# Patient Record
Sex: Female | Born: 1950
Health system: Southern US, Community
[De-identification: ages and names within clinical notes are randomized; demographics above are authoritative.]

## PROBLEM LIST (undated history)

## (undated) DIAGNOSIS — M1990 Unspecified osteoarthritis, unspecified site: Secondary | ICD-10-CM

## (undated) DIAGNOSIS — Z5189 Encounter for other specified aftercare: Secondary | ICD-10-CM

## (undated) DIAGNOSIS — F32A Depression, unspecified: Secondary | ICD-10-CM

## (undated) DIAGNOSIS — F419 Anxiety disorder, unspecified: Secondary | ICD-10-CM

## (undated) DIAGNOSIS — F329 Major depressive disorder, single episode, unspecified: Secondary | ICD-10-CM

## (undated) DIAGNOSIS — T7840XA Allergy, unspecified, initial encounter: Secondary | ICD-10-CM

## (undated) DIAGNOSIS — R011 Cardiac murmur, unspecified: Secondary | ICD-10-CM

## (undated) DIAGNOSIS — B977 Papillomavirus as the cause of diseases classified elsewhere: Secondary | ICD-10-CM

## (undated) DIAGNOSIS — F99 Mental disorder, not otherwise specified: Secondary | ICD-10-CM

## (undated) DIAGNOSIS — K219 Gastro-esophageal reflux disease without esophagitis: Secondary | ICD-10-CM

## (undated) DIAGNOSIS — E079 Disorder of thyroid, unspecified: Secondary | ICD-10-CM

## (undated) HISTORY — PX: HERNIA REPAIR: SHX51

## (undated) HISTORY — DX: Unspecified osteoarthritis, unspecified site: M19.90

## (undated) HISTORY — PX: RADIAL OPTIC NEUROTOMY: SHX2282

## (undated) HISTORY — DX: Anxiety disorder, unspecified: F41.9

## (undated) HISTORY — DX: Cardiac murmur, unspecified: R01.1

## (undated) HISTORY — PX: MENISCUS REPAIR: SHX5179

## (undated) HISTORY — DX: Gastro-esophageal reflux disease without esophagitis: K21.9

## (undated) HISTORY — DX: Mental disorder, not otherwise specified: F99

## (undated) HISTORY — PX: DILATION AND CURETTAGE OF UTERUS: SHX78

## (undated) HISTORY — DX: Depression, unspecified: F32.A

## (undated) HISTORY — DX: Encounter for other specified aftercare: Z51.89

## (undated) HISTORY — DX: Papillomavirus as the cause of diseases classified elsewhere: B97.7

## (undated) HISTORY — DX: Disorder of thyroid, unspecified: E07.9

## (undated) HISTORY — DX: Allergy, unspecified, initial encounter: T78.40XA

## (undated) HISTORY — PX: SEPTOPLASTY: SUR1290

## (undated) HISTORY — PX: LUMBAR SPINAL CORD SIMULATOR LEAD REMOVAL: SHX6813

## (undated) HISTORY — PX: TONSILLECTOMY: SHX5217

## (undated) HISTORY — PX: CATARACT EXTRACTION, BILATERAL: SHX1313

## (undated) HISTORY — DX: Major depressive disorder, single episode, unspecified: F32.9

---

## 1989-02-27 HISTORY — PX: BREAST REDUCTION SURGERY: SHX8

## 1997-08-28 ENCOUNTER — Other Ambulatory Visit: Admission: RE | Admit: 1997-08-28 | Discharge: 1997-08-28 | Payer: Self-pay | Admitting: Obstetrics and Gynecology

## 1997-09-25 ENCOUNTER — Other Ambulatory Visit: Admission: RE | Admit: 1997-09-25 | Discharge: 1997-09-25 | Payer: Self-pay | Admitting: *Deleted

## 1997-12-07 ENCOUNTER — Other Ambulatory Visit: Admission: RE | Admit: 1997-12-07 | Discharge: 1997-12-07 | Payer: Self-pay | Admitting: Obstetrics and Gynecology

## 1999-01-11 ENCOUNTER — Other Ambulatory Visit: Admission: RE | Admit: 1999-01-11 | Discharge: 1999-01-11 | Payer: Self-pay | Admitting: Obstetrics and Gynecology

## 2000-09-15 ENCOUNTER — Emergency Department (HOSPITAL_COMMUNITY): Admission: EM | Admit: 2000-09-15 | Discharge: 2000-09-15 | Payer: Self-pay | Admitting: Emergency Medicine

## 2001-03-15 ENCOUNTER — Ambulatory Visit (HOSPITAL_COMMUNITY): Admission: RE | Admit: 2001-03-15 | Discharge: 2001-03-15 | Payer: Self-pay | Admitting: Gastroenterology

## 2001-05-09 ENCOUNTER — Emergency Department (HOSPITAL_COMMUNITY): Admission: EM | Admit: 2001-05-09 | Discharge: 2001-05-09 | Payer: Self-pay | Admitting: Emergency Medicine

## 2001-08-06 ENCOUNTER — Ambulatory Visit (HOSPITAL_COMMUNITY): Admission: RE | Admit: 2001-08-06 | Discharge: 2001-08-06 | Payer: Self-pay | Admitting: Orthopedic Surgery

## 2001-08-06 ENCOUNTER — Encounter: Payer: Self-pay | Admitting: Orthopedic Surgery

## 2001-12-16 ENCOUNTER — Encounter: Payer: Self-pay | Admitting: Orthopedic Surgery

## 2001-12-16 ENCOUNTER — Ambulatory Visit (HOSPITAL_COMMUNITY): Admission: RE | Admit: 2001-12-16 | Discharge: 2001-12-16 | Payer: Self-pay | Admitting: Orthopedic Surgery

## 2002-02-25 ENCOUNTER — Other Ambulatory Visit: Admission: RE | Admit: 2002-02-25 | Discharge: 2002-02-25 | Payer: Self-pay | Admitting: Obstetrics and Gynecology

## 2002-02-27 HISTORY — PX: ROTATOR CUFF REPAIR: SHX139

## 2003-02-25 ENCOUNTER — Ambulatory Visit (HOSPITAL_COMMUNITY): Admission: RE | Admit: 2003-02-25 | Discharge: 2003-02-25 | Payer: Self-pay | Admitting: Rheumatology

## 2003-02-26 ENCOUNTER — Other Ambulatory Visit: Admission: RE | Admit: 2003-02-26 | Discharge: 2003-02-26 | Payer: Self-pay | Admitting: Obstetrics and Gynecology

## 2003-02-28 HISTORY — PX: LAMINECTOMY: SHX219

## 2003-04-03 ENCOUNTER — Emergency Department (HOSPITAL_COMMUNITY): Admission: EM | Admit: 2003-04-03 | Discharge: 2003-04-03 | Payer: Self-pay | Admitting: Emergency Medicine

## 2003-04-15 ENCOUNTER — Encounter: Admission: RE | Admit: 2003-04-15 | Discharge: 2003-04-15 | Payer: Self-pay | Admitting: Specialist

## 2003-06-19 ENCOUNTER — Ambulatory Visit (HOSPITAL_COMMUNITY): Admission: RE | Admit: 2003-06-19 | Discharge: 2003-06-20 | Payer: Self-pay | Admitting: Specialist

## 2003-08-11 ENCOUNTER — Encounter: Admission: RE | Admit: 2003-08-11 | Discharge: 2003-08-11 | Payer: Self-pay | Admitting: Specialist

## 2003-08-20 ENCOUNTER — Encounter: Admission: RE | Admit: 2003-08-20 | Discharge: 2003-08-20 | Payer: Self-pay | Admitting: Specialist

## 2003-09-07 ENCOUNTER — Encounter: Admission: RE | Admit: 2003-09-07 | Discharge: 2003-09-07 | Payer: Self-pay | Admitting: Specialist

## 2003-10-09 ENCOUNTER — Ambulatory Visit (HOSPITAL_BASED_OUTPATIENT_CLINIC_OR_DEPARTMENT_OTHER): Admission: RE | Admit: 2003-10-09 | Discharge: 2003-10-09 | Payer: Self-pay | Admitting: General Surgery

## 2003-11-19 ENCOUNTER — Ambulatory Visit (HOSPITAL_COMMUNITY): Admission: RE | Admit: 2003-11-19 | Discharge: 2003-11-19 | Payer: Self-pay | Admitting: Orthopaedic Surgery

## 2004-04-06 ENCOUNTER — Other Ambulatory Visit: Admission: RE | Admit: 2004-04-06 | Discharge: 2004-04-06 | Payer: Self-pay | Admitting: Obstetrics and Gynecology

## 2004-05-12 ENCOUNTER — Encounter
Admission: RE | Admit: 2004-05-12 | Discharge: 2004-05-12 | Payer: Self-pay | Admitting: Physical Medicine and Rehabilitation

## 2004-06-20 ENCOUNTER — Encounter: Admission: RE | Admit: 2004-06-20 | Discharge: 2004-06-20 | Payer: Self-pay | Admitting: General Surgery

## 2004-08-19 ENCOUNTER — Ambulatory Visit (HOSPITAL_COMMUNITY): Admission: RE | Admit: 2004-08-19 | Discharge: 2004-08-19 | Payer: Self-pay | Admitting: General Surgery

## 2004-12-05 ENCOUNTER — Ambulatory Visit (HOSPITAL_COMMUNITY)
Admission: RE | Admit: 2004-12-05 | Discharge: 2004-12-05 | Payer: Self-pay | Admitting: Physical Medicine and Rehabilitation

## 2005-02-27 HISTORY — PX: SPINE SURGERY: SHX786

## 2005-04-11 ENCOUNTER — Other Ambulatory Visit: Admission: RE | Admit: 2005-04-11 | Discharge: 2005-04-11 | Payer: Self-pay | Admitting: Obstetrics and Gynecology

## 2005-05-29 ENCOUNTER — Ambulatory Visit (HOSPITAL_COMMUNITY)
Admission: RE | Admit: 2005-05-29 | Discharge: 2005-05-29 | Payer: Self-pay | Admitting: Physical Medicine and Rehabilitation

## 2006-08-15 ENCOUNTER — Encounter: Admission: RE | Admit: 2006-08-15 | Discharge: 2006-08-15 | Payer: Self-pay | Admitting: Internal Medicine

## 2007-01-07 ENCOUNTER — Ambulatory Visit (HOSPITAL_COMMUNITY): Admission: RE | Admit: 2007-01-07 | Discharge: 2007-01-07 | Payer: Self-pay | Admitting: Surgery

## 2007-02-01 ENCOUNTER — Emergency Department (HOSPITAL_COMMUNITY): Admission: EM | Admit: 2007-02-01 | Discharge: 2007-02-01 | Payer: Self-pay | Admitting: Emergency Medicine

## 2007-02-11 ENCOUNTER — Encounter: Admission: RE | Admit: 2007-02-11 | Discharge: 2007-02-11 | Payer: Self-pay | Admitting: Internal Medicine

## 2007-03-31 HISTORY — PX: POLYPECTOMY: SHX149

## 2007-04-26 ENCOUNTER — Ambulatory Visit (HOSPITAL_COMMUNITY): Admission: RE | Admit: 2007-04-26 | Discharge: 2007-04-26 | Payer: Self-pay | Admitting: Obstetrics and Gynecology

## 2007-04-26 ENCOUNTER — Encounter (INDEPENDENT_AMBULATORY_CARE_PROVIDER_SITE_OTHER): Payer: Self-pay | Admitting: Obstetrics and Gynecology

## 2007-11-28 ENCOUNTER — Encounter
Admission: RE | Admit: 2007-11-28 | Discharge: 2007-11-28 | Payer: Self-pay | Admitting: Physical Medicine and Rehabilitation

## 2009-12-06 ENCOUNTER — Encounter: Admission: RE | Admit: 2009-12-06 | Discharge: 2009-12-06 | Payer: Self-pay | Admitting: Internal Medicine

## 2010-02-27 HISTORY — PX: OTHER SURGICAL HISTORY: SHX169

## 2010-03-20 ENCOUNTER — Encounter: Payer: Self-pay | Admitting: Specialist

## 2010-07-11 ENCOUNTER — Other Ambulatory Visit: Payer: Self-pay | Admitting: Dermatology

## 2010-07-12 NOTE — Op Note (Signed)
[name], Tracey                ACCOUNT NO.:  [id number]   MEDICAL RECORD NO.:  [id number]          PATIENT TYPE:  AMB   LOCATION:  DAY                          FACILITY:  [facility]   PHYSICIAN:  Ardeth Sportsman, MD     DATE OF BIRTH:  18-Oct-1950   DATE OF PROCEDURE:  01/07/2007  DATE OF DISCHARGE:                               OPERATIVE REPORT   PRIMARY CARE PHYSICIAN:  Juline Patch, M.D.   GASTROENTEROLOGIST:  Anselmo Rod, M.D.   SURGEON:  Ardeth Sportsman, MD   ASSISTANT:  None.   PREOPERATIVE DIAGNOSIS:  Left greater than right groin pains.  Possible  new left inguinal, or possible right indirect inguinal hernia.   POSTOPERATIVE DIAGNOSES:  1. Left indirect inguinal hernia.  2. Right, possible small, direct, recurrent, versus near obturator      defect.  3. Uterine fibroid at the dome of her uterus.   PROCEDURE:  1. Diagnostic laparoscopy.  2. Laparoscopic preperitoneal exploration with bilateral inguinal      hernia repair including coverage of indirect, direct, femoral, and      obturator defects.   ANESTHESIA:  1. General.  2. Local and a second field block around the port sites.  3. Bilateral ilioinguinal/general femoral nerve blocks.   SPECIMENS:  None.   DRAINS:  None.   ESTIMATED BLOOD LOSS:  100 mL.   COMPLICATIONS:  No major complications.   INDICATIONS:  [name] is a 60 year old female, very physically active  who has had atypical groin pain in the past.  She was found to have a  right inguinal hernia and then a right femoral hernia which were  repaired, open with mesh.  However, she started having new pain in the  left side with negative diagnostic workup.  She has had no improvement  on nonsteroidals for several weeks.  Because of her elusive exam and  workup in the past with a very similar history, I recommended that we do  diagnostic laparoscopy with possible left inguinal hernia repair.   On the day of surgery she noted that she was having  right groin  discomfort as well, and wondered if that was an issue.  She wishes that  area checked as well.  The risk of surgery such as stroke, heart attack,  deep venous thrombosis, pulmonary embolism, and death were discussed.  The risks such as bleeding, need for transfusion, wound infection,  abscess, injury to the organs, recurrence, incorrect diagnosis, and  other risk for discussed; and she agreed to proceed.   OPERATIVE FINDINGS:  She had a small but definite indirect inguinal  hernia on the left side.  The right side had some dense inflammation  from herr prior open inguinal and femoral hernia repairs.  She had dense  near her femoral region, and the medial to the inferior epigastric  vessels as well as some weakness there, concernibng for a direct defect.  There is a possible small obturator defect as well.  There was no  definite evidence of incarceration or strangulation.   DESCRIPTION OF PROCEDURE:  Informed consent was  confirmed.  The patient  underwent general anesthesia without any difficulty.  She had sequential  compression devices activated during the entire case.  She had IV  antibiotics given.  She was placed supine with both arms are.  She  underwent general anesthesia without any difficulty.  She had a Foley  catheter sterilely placed.  Her abdomen was prepped and draped in a  sterile fashion.   Entry with gained in the abdominal wall through an infraumbilical  curvilinear incision.  A nick was made in the rectus abdominis fascia,  just to the right of the midline, and a 12 mm Hasson port was passed in  the preperitoneal plane.  Capnopreperitoneum to 15 mmHg provided good  abdominal wall inflation.  Camera was used to help free the peritoneum  off the anterior abdominal wall.  Enough working space was created so  that 5 mm ports could be placed in the right-and-left midabdomen.   Once enough of the preperitoneum space was freed out, I switched to a  diagnostic  laparoscopy.  The 5 mm ports were directed in the right  midabdomen.  The abdominal port was directed into the peritoneal cavity.  The capnopreperitoneum  was reduced.  The camera was switched over to  5/30 degrees.  On direct visualization the other two ports were  redirected into the peritoneal cavity.  Camera inspection revealed an  obvious indirect inguinal hernia on the left side.  The right side had  some dense adhesions to the right inguinal region and it was difficult  to ascertain if she had a true hernia there or not.   I decided to do preperitoneal exploration.  The peritoneum focus was  turned to the left side.  The peritoneum was peeled off the lateral side  wall and down towards the internal ring.  The peritoneum was crawling up  with the round ligament up into the inguinal canal consistent with  indirect inguinal hernia.  An attempt was made to skeletonize the  peritoneum off the round ligament but in skeletonizing this the round  ligament started falling apart.  Therefore, I used cautery to ligate the  round ligament and it was transected.  The peritoneum was peeled back as  proximally as possible.  A window was made between the anterolateral  pelvic wall and the anterolateral bladder to expose this region in the  obturator and there was no evidence of any abnormalities there.   Attention was turned towards the right side.  Peritoneum was freed off  laterally rather easily and coming down and actually get behind and I  could free the peritoneum rather easily off the iliac vessels.  In  reflecting the peritoneum and freeing up posteriorly there were some  dense adhesions to the femoral canal.  Using careful sharp dissection  with no energy, I was able to free the peritoneum off this region.  There was a tear made in the peritoneum in this region and it was closed  using 3-0 Vicryl using laparoscopic suturing.  This helped to be able to  free the peritoneum as posteriorly  as possible. There was no evidence of  any indirect defect.  However, she did have some dense adhesions to her  direct space as well as on the way down to the obturator region. Freeing  off the direct space there was a laxity in that region concerning for a  possible small direct inguinal hernia as well.   I was able to free the peritoneum and  the anterolateral bladder somewhat  off the anterolateral pelvic wall.  The round ligament was skeletonized  and preserved.  There was a small branch off the round ligament bleeding  that was controlled with clips.  The round ligament was preserved.  I  was able to get down to the obturator canal and there was a little bit  of laxity in that region as well concerning for a small defect as well.   Because there had been a moderate amount of dissection and dense  adhesions near the bladder, I asked anesthesia to give indigo carmine.  It turned te urine a dark blue.  There was no leak of blue dye in the  preperitoneal fields.  The peritoneum was able to be grasped and pulled  cephalad to make sure there was good exposure with dissection off of the  pelvic side wall and make sure that everything was reduced out.   A 15 x 15 cm light weight (polypropylene) ULTRAPRO mesh was used for  each side.  The meshes were cut to a half-skull shape.  Mesh was placed  in each side in mirror image fashion such that the medial and inferior  flap rested covering around in the true pelvis between the  anterolateral, bilateral, and near the obturator foramen well.  The  meshes laid well posteriorly and proximally as the peritoneum was pulled  back on both sides.  The meshes laid laterally and superiorly such that  there was at least 3 inches circumferential coverage about direct,  indirect and femoral hernia defects.  A good swathe of flap of mesh was  covering around the level of the obturator as well.  Lead points were  grasped from both sides and elevated anteriorly  and then cephalad as the  capnopreperitoneum  was evacuated.  The ports were removed.  Fascia  defect was closed using #0 Vicryl stitch.  Skin was closed using 4-0  Monocryl stitch.  Sterile dressings were applied.  The patient was taken  to the recovery room in stable condition.  I explained the operative  findings to the patient's friend.  Questions answered and she expressed  understanding and appreciation.      Ardeth Sportsman, MD  Electronically Signed     SCG/MEDQ  D:  01/07/2007  T:  01/07/2007  Job:  [id number]   cc:   Juline Patch, M.D.  Fax: [phone]   JXBJYN WGN FAOZ, M.D.  Fax: [phone]

## 2010-07-12 NOTE — H&P (Signed)
Tracey [name], Tracey [name]                ACCOUNT NO.:  [id number]   MEDICAL RECORD NO.:  [id number]          PATIENT TYPE:  AMB   LOCATION:  SDC                           FACILITY:  [facility]   PHYSICIAN:  Hal Morales, M.D.DATE OF BIRTH:  February 21, 1951   DATE OF ADMISSION:  DATE OF DISCHARGE:                              HISTORY & PHYSICAL   DATE OF OPERATION:  April 26, 2007.   HISTORY OF PRESENT ILLNESS:  The patient is a 60 year old white married  female, para 0, who presents for evaluation of postmenopausal bleeding.  The patient has been menopausal since 2000 and has used hormone  replacement therapy since that time.  Her current regimen is a  bioidentical hormone regimen started by Dr. Austin Miles. Wanek, which  includes estradiol 0.75 mg with progesterone 100 mg daily.  The patient  had had no bleeding for the entirety of her menopausal time frame until  the beginning of February, when she had 3 days of bleeding, requiring 2  changes of pads per day.  She had some minimal cramping and came in for  evaluation.  At that time, she was set up for endometrial biopsy,  which  was not successful because of a stenotic cervix.  In the meantime, she  underwent a CBC and thyroid panel, both of which were normal.  She then  stopped bleeding but restarted bleeding more heavily on April 19, 2007, and continued bleeding through the present time.   PAST MEDICAL HISTORY:  Gynecologic:  The patient has a history of a  uterine fibroid which is pedunculated, the largest dimension being 2.5  cm, which has always been asymptomatic.  She has used an IUD for  contraception in the past.  She has always had normal Pap smears.  Medical history is significant for irritable bowel syndrome,  fibromyalgia, depression, anxiety.  The patient also has thyroid disease  and asthma.   SURGICAL HISTORY:  The patient has had two inguinal hernia repairs.  She  has had placement of a spinal cord stimulator.  She has  had a breast  reduction in the past.   CURRENT MEDICATIONS:  Rhinocort, estrogen, progesterone, Synthroid,  Accolate, Allegra, Klonopin, glucosamine chondroitin, fish oil, omega-3  fatty acids, Symbicort, Viactiv, Colace, vitamin C.   DRUG ALLERGIES:  INAPSINE AND CLINDAMYCIN.   REVIEW OF SYSTEMS:  Positive for back pain, thought to be secondary to  degenerative joint disease, hot flashes and occasional urinary urgency.   PHYSICAL EXAMINATION:  GENERAL:  The patient is a well-developed white  female in no acute distress.  VITAL SIGNS:  Blood pressure is 110/72.  Weight is 161 pounds.  Height  is 5 feet 3 inches.  LUNGS:  Clear.  HEART:  Regular rate and rhythm.  ABDOMEN:  Soft without masses or organomegaly.  EXTREMITIES:  No clubbing, cyanosis or edema.  PELVIC:  EG, BUS, within normal limits.  The vagina is atrophic.  The  cervix is stenotic.  The uterus is upper limits of normal size, mobile  and nontender.  Adnexa:  No masses.   LABORATORY STUDIES:  Ultrasound performed on April 23, 2007, showed a  posterior pedunculated fibroid measuring 1.6 cm in greatest dimension.  In the endometrium there is a hyperechoic mass measuring 0.67 cm in the  greatest dimension suggestive of a polyp.  The ovaries are within normal  limits.   IMPRESSION:  1. Postmenopausal bleeding.  2. Severely stenotic cervix precluding in-office endometrial biopsy.   DISPOSITION:  The patient will undergo hysteroscopy, dilatation and  curettage.  The risks of anesthesia, bleeding, infection, and damage to  adjacent organs as well as the risks of uterine perforation were all  explained, and the patient wishes to proceed.  This will be done at  [facility] on April 26, 2007.      Hal Morales, M.D.  Electronically Signed     VPH/MEDQ  D:  04/24/2007  T:  04/24/2007  Job:  [id number]

## 2010-07-12 NOTE — Op Note (Signed)
Tracey [name], Tracey [name]                ACCOUNT NO.:  [id number]   MEDICAL RECORD NO.:  [id number]          PATIENT TYPE:  AMB   LOCATION:  SDC                           FACILITY:  [facility]   PHYSICIAN:  Hal Morales, M.D.DATE OF BIRTH:  1950/11/02   DATE OF PROCEDURE:  04/26/2007  DATE OF DISCHARGE:                               OPERATIVE REPORT   PREOPERATIVE DIAGNOSIS:  Postmenopausal bleeding.   POSTOPERATIVE DIAGNOSIS:  Postmenopausal bleeding.   OPERATION:  Hysteroscopy D&C.   SURGEON:  Dr. Dierdre Forth.   ANESTHESIA:  General.   ESTIMATED BLOOD LOSS:  Less than 10 mL.   COMPLICATIONS:  None.   FINDINGS:  The uterus sounded to 7-1/2 cm.  There was a small amount of  fluffy endometrium in the cavity but no specific lesions were noted at  the time of hysteroscopy.   PROCEDURE:  The patient was taken to the operating room after  appropriate identification and placed on the operating table.  After the  attainment of adequate general anesthesia, she was placed in the  lithotomy position.  The perineum and vagina were prepped with multiple  layers of Betadine and the bladder emptied with a red Robinson catheter  under sterile conditions.  The perineum was draped as a sterile field.  A Graves speculum was placed in the vagina and a single-tooth tenaculum  placed on the anterior cervix.  A paracervical block was achieved with a  total of 10 mL of 2% Xylocaine in the 5 and 7 o'clock positions.  The  cervix was then dilated to accommodate the diagnostic hysteroscope.  The  uterus was sounded.  The endocervical curettings were obtained.  The  hysteroscope was used to visualize the endometrial cavity and the above-  noted findings were made and documented.  A grasping forceps was used to  sample an area of shaggy endometrium.  The remainder of the endometrial  cavity was then sharply curetted with a curette and those curettings  removed from the operative field.  All instruments  were then removed  from the vagina and the patient awakened from general anesthesia and  taken to the recovery room in satisfactory condition having tolerated  the procedure well.  Sponge and instrument counts correct.   SPECIMENS TO PATHOLOGY:  Endometrial curettings and endocervical  curettings.   DISCHARGE INSTRUCTIONS:  Printed instructions from the [facility]  for [facility].   DISCHARGE MEDICATIONS:  Ibuprofen 600 mg p.o. q.6 h x24 hours and then  p.r.n. pain.   FOLLOW-UP:  2 weeks with Dr. Pennie Rushing.      Hal Morales, M.D.  Electronically Signed     VPH/MEDQ  D:  04/26/2007  T:  04/27/2007  Job:  [id number]

## 2010-07-15 NOTE — Op Note (Signed)
NAMEWILMETTA, SPEISER                          ACCOUNT NO.:  [id number]   MEDICAL RECORD NO.:  [id number]                   PATIENT TYPE:  OIB   LOCATION:  5034                                 FACILITY:  [facility]   PHYSICIAN:  Kerrin Champagne, M.D.                DATE OF BIRTH:  03-29-1950   DATE OF PROCEDURE:  06/19/2003  DATE OF DISCHARGE:  06/20/2003                                 OPERATIVE REPORT   PREOPERATIVE DIAGNOSIS:  Herniated nucleus pulposus right L4-5 foraminal and  extraforaminal.   POSTOPERATIVE DIAGNOSIS:  Herniated nucleus pulposus right L4-5 foraminal  and extraforaminal.   PROCEDURE:  Extraforaminal approach, low on the right side for excision of  the herniated nucleus pulposus right L4-5.   SURGEON:  Kerrin Champagne, M.D.   ASSISTANT:  Wende Neighbors, P.A.-C.   ANESTHESIA:  GOT, Dr. Gypsy Balsam.   ESTIMATED BLOOD LOSS:  25 cc.   DRAINS:  None.   BRIEF CLINICAL HISTORY:  This patient is a 60 year old female who sustained  injury to her back September 2003.  She has been experiencing persistent  back pain with radiation into her legs since that time.  She had undergone  an extensive conservative mode of therapy and conservative treatment until  three months ago where her pain has increased to the point where she has  stopped working.  She was scheduled to undergo artificial disk replacement  in [place] some 3 weeks ago. This was scheduled for last week; however, the  patient's insurance company denied coverage because of poor follow up  studies in literature.  The patient is brought to the operating room to  undergo a right-sided, extraforaminal approach for excision of an  extraforaminal and foraminal disk protrusion right L4-5 level affecting  primarily the right L4 nerve root.  She does have a small disk protrusion,  also, into the neuroforamen on the left side at L5-S1; however, the mass  effect if judged to be relatively compared to that on the right  side.   INTRAOPERATIVE FINDINGS:  The patient had disk protrusion to the right side,  extraforaminal into the foramen affecting the right L4 nerve root.  The disk  bulge was excised extraforaminally.   DESCRIPTION OF PROCEDURE:  After adequate general anesthesia the patient  knee-chest position, Andrew's frame, standard preoperative antibiotics,  standard prep with duraprep solution, and all pressure points well padded.  Draped in the usual  manner, Iodine Vidrape was used.  Two spinal needles  placed in the expected L5 and L4 levels in the region where the expected  transverse process of L5 and of L4 would be localized.  These were observed  on the lateral radiograph to be in good position and alignment so that an  incision was made about 1-1/2 inches to the right of the midline, sagittally  oriented approximately 2 to 2-1/2 inches in length through the skin and  subcutaneous layers down to the lumbar dorsal fascia.  The lumbar dorsal  fascia then incised with the skin incision and the rectus spiny muscle then  spread bluntly, palpating the tips of the transverse process of L4 and L5.  An Allis clamp then placed on the tip of the transverse process of L4 and  intraoperative and lateral radiograph demonstrating the clamp on the tip of  the transverse process.   A Voss retractor then inserted for localization and for exposure of the  posterior lateral approach and the inner transverse approach to the  patient's L4-5 level.  The transverse process of L4 and of L5 then quickly  identified.  These were debrided of soft tissue attachment and remained well-  exposed throughout the case. Bleeders were controlled using bipolar  electrocautery.  A 3-mm Kerrison introduced to carefully free the attachment  of the inner transverse membrane over the superior aspect of the L5  transverse process as well as the inner transverse muscle resected.  A very  large vein was noted to be present overlying  the L4 nerve root in the  extraforaminal region.  Carefully the most inferior branch of this vein was  cauterized and then divided in order to allow for exposure of the L4 nerve  root as well as entry into the triangle between the transverse process the  L4 nerve root, the pedicle of L5 to the level of the disk at the L4-5 level.  Care was taken to perform medial exposure to extend laterally.  The L4 nerve  root was identified, and D'Errico used to retract the nerve superiorly.   Penfield #4 then used to localize and identify the disk at the L4-5 level  posteriorly and laterally carefully freeing up soft tissue attachments here.  The disk was felt to be protruding posteriorly into the right side affecting  the undersurface of the anterior surface of the L4 nerve root.  A 15 blade  scalpel was then introduced removing a window of disk materially  posterolaterally off of the L4-5 disk.  Straight pituitaries and up bite  pituitaries were then used to excise the disk in a subligamentous fashion  here that was protruding diffusely affecting the L4 nerve root.  Following  the disk excision, then a hockey stick neuroprobe could be passed anteriorly  to the L4 nerve root within the nerve foramen demonstrating complete patency  here; and then posterior to the nerve within the neural foramen  demonstrating complete patency.  The remaining venous plexus remained intact  over the nerve root posteriorly.  Irrigation was performed.  Careful  inspection demonstrated no further debris present here.  A reversed angle  curette was used to carefully probe the end plates.  There was a bit of a  lip to the posterolateral aspect of the inferior portion at L4 vertebral  body present, however, it is felt that this did not require further  debridement.  It was not a mass effect on the 4 nerve root.   Irrigation, again, performed. There was no bleeding evident at this point. No active bleeding felt to be seen.   All portion of the thrombin-soaked  Gelfoam was then placed extending from the transverse process of L4-L5 and  then this paralumbar muscles were allowed to fall back into place.  The  patient's lumbodorsal fascia reapproximated with interrupted #0 Vicryl  sutures and the deep subcu layers with interrupted #0 Vicryl sutures, the  more superficial layers with interrupted 2-0 Vicryl sutures and  the skin  closed with a running subcu stitch of 4-0 Vicryl.  Anterior Benzoin and  Steri-Strips applied, 4 x 4's fixed to the skin with Hypafix tape.  The  patient then returned to the supine position, reactivated, extubated, and  returned to the recovery room in satisfactory condition.  All instruments  and sponge counts were correct.                                               Kerrin Champagne, M.D.    Myra Rude  D:  06/19/2003  T:  06/21/2003  Job:  [id number]

## 2010-07-15 NOTE — Op Note (Signed)
[name], Tracey [name]                ACCOUNT NO.:  [id number]   MEDICAL RECORD NO.:  [id number]          PATIENT TYPE:  AMB   LOCATION:  DAY                          FACILITY:  [facility]   PHYSICIAN:  Sharlet Salina T. Hoxworth, M.D.DATE OF BIRTH:  11/11/1950   DATE OF PROCEDURE:  08/19/2004  DATE OF DISCHARGE:                                 OPERATIVE REPORT   PREOPERATIVE DIAGNOSIS:  Right groin pain.   POSTOPERATIVE DIAGNOSIS:  Right femoral hernia.   SURGICAL PROCEDURES:  Repair of right femoral hernia with mesh plug.   SURGEON:  Lorne Skeens. Hoxworth, M.D.   ANESTHESIA:  General.   BRIEF HISTORY:  Tracey [name] is a 60 year old female who is status post  repair of a right indirect inguinal hernia with mesh almost one year ago.  She now presents with recurrent and increasing right groin pain. On  examination, there appears to be a questionable soft mass around the  inguinal ligament that is tender. Due to persistent symptoms, we have  elected to reexplore the right groin with a question of recurrent hernia.  The nature of the procedure, indications, risks of bleeding, infection and  chronic pain were discussed and understood. She is now brought to the  operating room for this procedure.   DESCRIPTION OF PROCEDURE:  The patient was brought to the operating room,  placed in supine position on the operating table and general anesthesia  laryngeal mask was induced. The right groin was sterilely prepped and  draped. She was given preoperative IV antibiotics. The previous oblique  incision on the right groin was used and dissection carried down through the  subcutaneous tissue. There was moderate scarring but the external oblique  was able to be identified easily and was cleared down to the external ring  and inguinal ligament. At this point, there was an obvious palpable soft  tissue mass presenting just below the inguinal ligament in the femoral  canal. This was dissected up and was an  approximately 2 to 3 cm mass of  preperitoneal fat that was herniating through a femoral defect. This did not  contain bowel or peritoneum. This specimen was divided between hemostats at  the femoral canal, removed and tied with 3-0 Vicryl. There was about a 1 cm  defect at the femoral canal. I elected to repair this from below with a mesh  plug. A Prolene mesh plug was fashioned about 2 1/2 cm in length, the  diameter tightly filled the defect and this was placed up through the defect  with the end just protruding at the inguinal ligament. The inguinal ligament  was then closed down to the posterior fascia with several interrupted #0  Prolene sutures incorporating the tip of the mesh plug and completely  closing the defect. The soft tissue was then infiltrated with  Marcaine. The subcu was closed with running 3-0 Vicryl and the skin closed  with running subcuticular 4-0 Monocryl and Steri-Strips. Sponge, needle and  instrument counts were correct. Dry sterile dressings were applied and the  patient taken to the recovery room in good condition.  BTH/MEDQ  D:  08/19/2004  T:  08/19/2004  Job:  [id number]

## 2010-07-15 NOTE — Op Note (Signed)
Tracey [name], Tracey [name]                          ACCOUNT NO.:  [id number]   MEDICAL RECORD NO.:  [id number]                   PATIENT TYPE:  AMB   LOCATION:  DSC                                  FACILITY:  [facility]   PHYSICIAN:  Sharlet Salina T. Hoxworth, M.D.          DATE OF BIRTH:  1950/10/24   DATE OF PROCEDURE:  DATE OF DISCHARGE:                                 OPERATIVE REPORT   DATE OF OPERATION:  October 09, 2003.   PREOPERATIVE DIAGNOSES:  Right inguinal hernia.   POSTOPERATIVE DIAGNOSES:  Right inguinal hernia.   SURGICAL PROCEDURE:  Repair of right inguinal hernia.   SURGEON:  Lorne Skeens. Hoxworth, MD.   ANESTHESIA:  Laryngeal mask general.   BRIEF HISTORY:  Tracey [name] is a 60 year old female with persistent pain in  her right groin and on examination has a definite reducible right inguinal  hernia.  Repair under general anesthesia has been recommended and accepted.  The nature of the procedure, indications, and risks of bleeding, infection,  and recurrence were discussed and understood.  She is now brought to the  operating room for this procedure.   DESCRIPTION OF OPERATION:  The patient was brought to the operating room and  placed in the supine position on the operating table, and laryngeal mask  general anesthesia was induced.  The right groin was sterilely prepped and  draped.  She received preoperative antibiotics.  An oblique incision was  made in the right groin and dissection carried down through the subcutaneous  tissue using cautery.  The external oblique was identified clear down to the  inguinal ligament and external ring, and divided along the lines of the  __________ through the external ring.  The ilioinguinal nerve was identified  and protected throughout the remainder of dissection.  The round ligament  was dissected free at the pubic tubercle, divided between clamps, and tied  with a Vicryl tie.  This was then dissected back up toward the internal  ring.  A peritoneal hernia sac was encountered and dissected free from the  round ligament.  The round ligament and the hernia sac were then clamped,  divided, and tied at or above the level of the internal ring.  The mildly  dilated internal ring was then closed with interrupted 2-0 Prolene between  the internal oblique medially and the ileopubic tract laterally.  A piece of  Parietex mesh was then cut to size to cover the floor, internal ring, and  the lateral inguinal canal.  It was sutured with running 2-0 Prolene to the  inguinal ligament laterally and with interrupted 2-0 Prolene to the edge of  the rectus sheath medially.  This provided nice, broad coverage of the  direct and indirect spaces.  The external oblique was then closed over this  with running 3-0 Vicryl.  Scarpa's fascia was closed with running 3-0  Vicryl.  The skin was closed with running subcuticular 4-0  Monocryl and  Steri-Strips.  The sponge, needle, and instrument counts were correct.  Gauze dressings were applied, and the patient taken to the recovery room in  good condition.                                               Lorne Skeens. Hoxworth, M.D.    Tory Emerald  D:  10/09/2003  T:  10/10/2003  Job:  [id number]

## 2010-07-15 NOTE — Procedures (Signed)
[facility] [facility]  Patient:    Tracey [name], Tracey [name] Visit Number: [id number] MRN: [id number]          Service Type: END Location: ENDO Attending Physician:  Charna Elizabeth Dictated by:   Anselmo Rod, M.D. Proc. Date: 03/15/01 Admit Date:  03/15/2001 Discharge Date: 03/15/2001   CC:         Vanessa P. Pennie Rushing, M.D.   Procedure Report  DATE OF BIRTH:  March 29, 1950.  PROCEDURE:  Colonoscopy.  ENDOSCOPIST:  Anselmo Rod, M.D.  INSTRUMENTS:  Olympus video colonoscope.  INDICATION:  History of rectal bleeding with severe constipation and family history of colon and breast cancer in a 60 year old white female.  Rule out colonic polyps, masses, hemorrhoids, etc.  INFORMED CONSENT:  Informed consent was procured from the patient.  PREPARATION:  The patient was fasted for eight hours prior to the procedure and prepped with a bottle of magnesium citrate and a gallon of NuLYTELY the night prior to the procedure.  PHYSICAL EXAMINATION:  VITAL SIGNS:  Stable.  NECK:  Supple.  CHEST:  Clear to auscultation.  HEART:  S1 and S2 regular.  ABDOMEN:  Soft with normal bowel sounds.  DESCRIPTION OF PROCEDURE:  The patient was placed in the left lateral decubitus position and sedated with Demerol and Versed.  Once the patient was adequately sedated and maintained on low flow oxygen and continuous cardiac monitoring, the Olympus video colonoscope was advanced from the rectum to the cecum with difficulty.  The patient had a very tortuous colon with some residual stool in the right and transverse colon.  Small internal and external hemorrhoids were seen on retroflexion on anal inspection.  There was no evidence of masses or polyps in the colon.  The patients procedure was completely terminated in which appeared healthy. The entire colonic mucosa appeared healthy with a normal vascular pattern.  IMPRESSION: 1. Normal-appearing colonic mucosa up to  the cecum. 2. Normal terminal ileum. 3. No masses or polyps seen. 4. Tortuous colon. 5. Small nonbleeding internal/external hemorrhoid.  RECOMMENDATIONS:  A high fiber diet has been recommended along with stool softeners.  Laxatives are to be avoided.  Repeat colorectal screening recommended in the next five years unless the patient were to develop any abnormal symptoms in the interim. Dictated by:   Anselmo Rod, M.D. Attending Physician:  Charna Elizabeth DD:  03/15/01 TD:  03/17/01 Job: [id number]

## 2010-09-29 ENCOUNTER — Other Ambulatory Visit: Payer: Self-pay | Admitting: Internal Medicine

## 2010-09-29 DIAGNOSIS — E041 Nontoxic single thyroid nodule: Secondary | ICD-10-CM

## 2010-10-06 ENCOUNTER — Ambulatory Visit
Admission: RE | Admit: 2010-10-06 | Discharge: 2010-10-06 | Disposition: A | Discharge status: Home / Self Care | Payer: Medicare Other | Source: Ambulatory Visit | Attending: Internal Medicine | Admitting: Internal Medicine

## 2010-10-06 DIAGNOSIS — E041 Nontoxic single thyroid nodule: Secondary | ICD-10-CM

## 2010-10-26 ENCOUNTER — Ambulatory Visit (INDEPENDENT_AMBULATORY_CARE_PROVIDER_SITE_OTHER): Payer: Medicare Other | Admitting: General Surgery

## 2010-10-26 ENCOUNTER — Encounter (INDEPENDENT_AMBULATORY_CARE_PROVIDER_SITE_OTHER): Payer: Self-pay | Admitting: General Surgery

## 2010-10-26 VITALS — BP 122/76 | HR 72 | Temp 98.2°F | Ht 63.0 in | Wt 126.2 lb

## 2010-10-26 DIAGNOSIS — R1030 Lower abdominal pain, unspecified: Secondary | ICD-10-CM

## 2010-10-26 DIAGNOSIS — R109 Unspecified abdominal pain: Secondary | ICD-10-CM

## 2010-10-26 NOTE — Patient Instructions (Signed)
Using Aleve twice daily. If symptoms resolve no need for followup. Will see back in 8 weeks if symptoms persist.

## 2010-10-26 NOTE — Progress Notes (Signed)
Chief complaint: Right groin pain  History: Patient returns to the office due to recurrent right groin pain. She has a complex history of Open right inguinal hernia repair by me in 2005 and then subsequent femoral hernia on the right repaired by me with a plug in 2005. She then underwent laparoscopic repair of an apparent small indirect left inguinal hernia by Dr. gross in 2008 and he also placed a preperitoneal piece of mesh on the right due to some discomfort and what is described as a questionable small direct defect.She now states that for about one month she has had some persistent right groin pain. This happened fairly suddenly when she was walking. She now gets aching discomfort just in the inguinal canal with activity relieved by rest. She is not felt a definite lump. It seems a little better than it did several weeks ago.  Exam: Gen.: Thin healthy-appearing Abdomen: Generally soft and nontender. There are well-healed incisions. In the right groin there is a fairly discrete area of tenderness in the mid incision overlying the inguinal canal but with her coughing and straining and Valsalva maneuver I feel no evidence of hernia.  Assessment and plan: I suspect this is a strain rather than recurrent hernia. She has had mesh placed both anteriorly and pre-peritoneally in this area. We are going to use heat and anti-inflammatories. She will return in 6-8 weeks if there is no improvement.

## 2010-12-06 LAB — HEMOGLOBIN AND HEMATOCRIT, BLOOD
HCT: 42.2
Hemoglobin: 14.6

## 2011-02-28 DIAGNOSIS — B977 Papillomavirus as the cause of diseases classified elsewhere: Secondary | ICD-10-CM

## 2011-02-28 HISTORY — DX: Papillomavirus as the cause of diseases classified elsewhere: B97.7

## 2012-02-01 ENCOUNTER — Telehealth: Payer: Self-pay | Admitting: Obstetrics and Gynecology

## 2012-02-01 NOTE — Telephone Encounter (Signed)
Spoke with pt rgd concerns pt states was seen bygi had pre cancer cells in anus need aex soon pt has appt 02/09/12 ay 10:00 with ep pt voice understanding

## 2012-02-09 ENCOUNTER — Encounter: Payer: Self-pay | Admitting: Obstetrics and Gynecology

## 2012-02-09 ENCOUNTER — Ambulatory Visit (INDEPENDENT_AMBULATORY_CARE_PROVIDER_SITE_OTHER): Payer: Medicare Other | Admitting: Obstetrics and Gynecology

## 2012-02-09 VITALS — BP 100/62 | HR 60 | Ht 63.5 in | Wt 131.0 lb

## 2012-02-09 DIAGNOSIS — Z01419 Encounter for gynecological examination (general) (routine) without abnormal findings: Secondary | ICD-10-CM

## 2012-02-09 DIAGNOSIS — Z124 Encounter for screening for malignant neoplasm of cervix: Secondary | ICD-10-CM

## 2012-02-09 NOTE — Progress Notes (Signed)
Regular Periods: no Mammogram: yes  Monthly Breast Ex.: yes Exercise: yes  Tetanus < 10 years: yes Seatbelts: yes  NI. Bladder Functn.: yes Abuse at home: no  Daily BM's: yes Stressful Work: no  Healthy Diet: yes Sigmoid-Colonoscopy: 2013  HAD SPOT INSIDE THE ANUS  Calcium: yes Medical problems this year: DISCUSS COLONOSCOPY    LAST PAP:2011  Contraception: PM  Mammogram:  2013   NL  PCP: DR. Juline Patch  PMH: ARTHRITIS  WUJ:WJXBJY AND WIFE DIED, AUNT DIED AND HUSBAND; ALL LAST YEAR  Last Bone Scan: 2013   NL DR. PANG OFFICE

## 2012-02-09 NOTE — Progress Notes (Addendum)
Subjective:    Tracey [name] is a 61 y.o. female G0P0 who presents for annual exam. The patient has no complaints except that she was found to have condyloma in the anal area during colonoscopy.  Was told by Dr. Loreta Ave (GI physician) that it was small and not serious.  Review of Systems Gastrointestinal:No change in bowel habits, no abdominal pain, no rectal bleeding Genitourinary:negative for abnormal vaginal bleeding,  dysuria, frequency, hematuria, nocturia and urinary incontinence  Objective:     BP 100/62  Ht 5' 3.5" (1.613 m)  Wt 131 lb (59.421 kg)  BMI 22.84 kg/m2 Weight:  Wt Readings from Last 1 Encounters:  02/09/12 131 lb (59.421 kg)   Body mass index is 22.84 kg/(m^2). General Appearance:  Well nourished in no acute distress HEENT: Grossly normal Neck / Thyroid: Supple, no masses, nodes or enlargement Lungs: Clear to auscultation bilaterally Back: No CVA tenderness Breast Exam: No masses or nodes.No dimpling, nipple retraction or discharge. Cardiovascular: Regular rate and rhythm.  Gastrointestinal: Soft, non-tender, no masses or organomegaly Pelvic Exam: EGBUS-normal, vagina-atrophic and narrowed, cervix-stenotic, no tenderness or lesions, uterus-appears normal size, shape and consistency, adnexae-no masses or tenderness Rectovaginal: deferred-colonoscopy a month ago Lymphatic Exam: Non-palpable nodes in neck, clavicular, axillary, or inguinal regions Skin: No rash or abnormalities Extremities: no clubbing cyanosis or edema Neurologic: Grossly normal Psychiatric: Alert and oriented x 3    Assessment:   Routine GYN Exam Anal condyloma-new diagnosis   Plan:  Reviewed HPV, both high and low risk along with management options  PAP sent  RTO 2 years  or prn  Reviewed revised guidelines for PAP smear maintenance schedule.  Tracey [name],ELMIRAPA-C

## 2012-02-12 ENCOUNTER — Other Ambulatory Visit: Payer: Self-pay | Admitting: Physical Medicine and Rehabilitation

## 2012-02-12 DIAGNOSIS — M5030 Other cervical disc degeneration, unspecified cervical region: Secondary | ICD-10-CM

## 2012-02-12 LAB — PAP IG (IMAGE GUIDED)

## 2012-02-13 ENCOUNTER — Other Ambulatory Visit: Payer: Self-pay | Admitting: Physical Medicine and Rehabilitation

## 2012-02-13 DIAGNOSIS — M5030 Other cervical disc degeneration, unspecified cervical region: Secondary | ICD-10-CM

## 2012-02-14 ENCOUNTER — Other Ambulatory Visit: Payer: Medicare Other

## 2012-02-19 ENCOUNTER — Ambulatory Visit
Admission: RE | Admit: 2012-02-19 | Discharge: 2012-02-19 | Disposition: A | Discharge status: Home / Self Care | Payer: Medicare Other | Source: Ambulatory Visit | Attending: Physical Medicine and Rehabilitation | Admitting: Physical Medicine and Rehabilitation

## 2012-02-19 VITALS — BP 117/72 | HR 54

## 2012-02-19 DIAGNOSIS — M5030 Other cervical disc degeneration, unspecified cervical region: Secondary | ICD-10-CM

## 2012-02-19 MED ORDER — IOHEXOL 300 MG/ML  SOLN
10.0000 mL | Freq: Once | INTRAMUSCULAR | Status: AC | PRN
  Administered 2012-02-19: 10 mL via INTRATHECAL

## 2012-02-19 MED ORDER — MEPERIDINE HCL 100 MG/ML IJ SOLN
50.0000 mg | Freq: Once | INTRAMUSCULAR | Status: AC
  Administered 2012-02-19: 50 mg via INTRAMUSCULAR

## 2012-02-19 MED ORDER — DIAZEPAM 5 MG PO TABS
10.0000 mg | ORAL_TABLET | Freq: Once | ORAL | Status: AC
  Administered 2012-02-19: 10 mg via ORAL

## 2012-02-19 MED ORDER — ONDANSETRON HCL 4 MG/2ML IJ SOLN
4.0000 mg | Freq: Once | INTRAMUSCULAR | Status: AC
  Administered 2012-02-19: 4 mg via INTRAMUSCULAR

## 2012-02-19 NOTE — Progress Notes (Signed)
Patient states she has been off Lexapro for the past two days.  Donell Sievert, RN

## 2012-06-12 ENCOUNTER — Other Ambulatory Visit: Payer: Self-pay | Admitting: Endocrinology

## 2012-06-12 DIAGNOSIS — E042 Nontoxic multinodular goiter: Secondary | ICD-10-CM

## 2012-06-20 ENCOUNTER — Ambulatory Visit
Admission: RE | Admit: 2012-06-20 | Discharge: 2012-06-20 | Disposition: A | Discharge status: Home / Self Care | Payer: Medicare Other | Source: Ambulatory Visit | Attending: Endocrinology | Admitting: Endocrinology

## 2012-06-20 DIAGNOSIS — E042 Nontoxic multinodular goiter: Secondary | ICD-10-CM

## 2013-06-04 ENCOUNTER — Other Ambulatory Visit: Payer: Self-pay | Admitting: Endocrinology

## 2013-06-04 DIAGNOSIS — E042 Nontoxic multinodular goiter: Secondary | ICD-10-CM

## 2013-06-12 ENCOUNTER — Ambulatory Visit
Admission: RE | Admit: 2013-06-12 | Discharge: 2013-06-12 | Disposition: A | Discharge status: Home / Self Care | Payer: Medicare Other | Source: Ambulatory Visit | Attending: Endocrinology | Admitting: Endocrinology

## 2013-06-12 DIAGNOSIS — E042 Nontoxic multinodular goiter: Secondary | ICD-10-CM

## 2014-10-19 ENCOUNTER — Ambulatory Visit (INDEPENDENT_AMBULATORY_CARE_PROVIDER_SITE_OTHER): Payer: Medicare PPO | Admitting: Internal Medicine

## 2014-10-19 VITALS — BP 126/62 | HR 67 | Temp 98.4°F | Resp 16 | Ht 63.5 in | Wt 128.8 lb

## 2014-10-19 DIAGNOSIS — M542 Cervicalgia: Secondary | ICD-10-CM | POA: Diagnosis not present
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
## 2016-02-25 ENCOUNTER — Other Ambulatory Visit: Payer: Self-pay | Admitting: Obstetrics and Gynecology

## 2016-06-21 ENCOUNTER — Other Ambulatory Visit: Payer: Self-pay | Admitting: Endocrinology

## 2016-06-21 DIAGNOSIS — E049 Nontoxic goiter, unspecified: Secondary | ICD-10-CM

## 2016-07-07 ENCOUNTER — Other Ambulatory Visit: Payer: Self-pay | Admitting: Orthopedic Surgery

## 2016-07-07 ENCOUNTER — Ambulatory Visit
Admission: RE | Admit: 2016-07-07 | Discharge: 2016-07-07 | Disposition: A | Discharge status: Home / Self Care | Payer: Medicare PPO | Source: Ambulatory Visit | Attending: Orthopedic Surgery | Admitting: Orthopedic Surgery

## 2016-07-07 DIAGNOSIS — M2391 Unspecified internal derangement of right knee: Secondary | ICD-10-CM

## 2016-07-07 MED ORDER — IOPAMIDOL (ISOVUE-M 200) INJECTION 41%
30.0000 mL | Freq: Once | INTRAMUSCULAR | Status: AC
  Administered 2016-07-07: 30 mL via INTRA_ARTICULAR

## 2016-09-13 ENCOUNTER — Ambulatory Visit
Admission: RE | Admit: 2016-09-13 | Discharge: 2016-09-13 | Disposition: A | Discharge status: Home / Self Care | Payer: Medicare PPO | Source: Ambulatory Visit | Attending: Endocrinology | Admitting: Endocrinology

## 2016-09-13 DIAGNOSIS — E049 Nontoxic goiter, unspecified: Secondary | ICD-10-CM

## 2017-03-22 DIAGNOSIS — R69 Illness, unspecified: Secondary | ICD-10-CM | POA: Diagnosis not present

## 2017-04-24 DIAGNOSIS — M25561 Pain in right knee: Secondary | ICD-10-CM | POA: Diagnosis not present

## 2017-05-17 DIAGNOSIS — M1711 Unilateral primary osteoarthritis, right knee: Secondary | ICD-10-CM | POA: Diagnosis not present

## 2017-05-17 DIAGNOSIS — M25561 Pain in right knee: Secondary | ICD-10-CM | POA: Diagnosis not present

## 2017-05-30 DIAGNOSIS — M1711 Unilateral primary osteoarthritis, right knee: Secondary | ICD-10-CM | POA: Diagnosis not present

## 2017-06-14 DIAGNOSIS — R69 Illness, unspecified: Secondary | ICD-10-CM | POA: Diagnosis not present

## 2017-06-19 DIAGNOSIS — E039 Hypothyroidism, unspecified: Secondary | ICD-10-CM | POA: Diagnosis not present

## 2017-06-21 DIAGNOSIS — E039 Hypothyroidism, unspecified: Secondary | ICD-10-CM | POA: Diagnosis not present

## 2017-06-26 DIAGNOSIS — M84361D Stress fracture, right tibia, subsequent encounter for fracture with routine healing: Secondary | ICD-10-CM | POA: Diagnosis not present

## 2017-07-05 DIAGNOSIS — H524 Presbyopia: Secondary | ICD-10-CM | POA: Diagnosis not present

## 2017-07-05 DIAGNOSIS — H2513 Age-related nuclear cataract, bilateral: Secondary | ICD-10-CM | POA: Diagnosis not present

## 2017-07-05 DIAGNOSIS — H25013 Cortical age-related cataract, bilateral: Secondary | ICD-10-CM | POA: Diagnosis not present

## 2017-07-05 DIAGNOSIS — H02839 Dermatochalasis of unspecified eye, unspecified eyelid: Secondary | ICD-10-CM | POA: Diagnosis not present

## 2017-07-05 DIAGNOSIS — H1852 Epithelial (juvenile) corneal dystrophy: Secondary | ICD-10-CM | POA: Diagnosis not present

## 2017-07-12 DIAGNOSIS — M2241 Chondromalacia patellae, right knee: Secondary | ICD-10-CM | POA: Diagnosis not present

## 2017-07-12 DIAGNOSIS — M25561 Pain in right knee: Secondary | ICD-10-CM | POA: Diagnosis not present

## 2017-08-31 DIAGNOSIS — M25569 Pain in unspecified knee: Secondary | ICD-10-CM | POA: Diagnosis not present

## 2017-09-20 DIAGNOSIS — E039 Hypothyroidism, unspecified: Secondary | ICD-10-CM | POA: Diagnosis not present

## 2017-09-24 DIAGNOSIS — M533 Sacrococcygeal disorders, not elsewhere classified: Secondary | ICD-10-CM | POA: Diagnosis not present

## 2017-09-24 DIAGNOSIS — L739 Follicular disorder, unspecified: Secondary | ICD-10-CM | POA: Diagnosis not present

## 2017-10-22 DIAGNOSIS — D3613 Benign neoplasm of peripheral nerves and autonomic nervous system of lower limb, including hip: Secondary | ICD-10-CM | POA: Diagnosis not present

## 2017-10-22 DIAGNOSIS — D1801 Hemangioma of skin and subcutaneous tissue: Secondary | ICD-10-CM | POA: Diagnosis not present

## 2017-10-22 DIAGNOSIS — L821 Other seborrheic keratosis: Secondary | ICD-10-CM | POA: Diagnosis not present

## 2017-10-22 DIAGNOSIS — L812 Freckles: Secondary | ICD-10-CM | POA: Diagnosis not present

## 2017-10-22 DIAGNOSIS — L578 Other skin changes due to chronic exposure to nonionizing radiation: Secondary | ICD-10-CM | POA: Diagnosis not present

## 2017-11-13 DIAGNOSIS — Z23 Encounter for immunization: Secondary | ICD-10-CM | POA: Diagnosis not present

## 2017-12-26 DIAGNOSIS — M47812 Spondylosis without myelopathy or radiculopathy, cervical region: Secondary | ICD-10-CM | POA: Diagnosis not present

## 2017-12-27 DIAGNOSIS — Z0100 Encounter for examination of eyes and vision without abnormal findings: Secondary | ICD-10-CM | POA: Diagnosis not present

## 2018-01-09 DIAGNOSIS — M5030 Other cervical disc degeneration, unspecified cervical region: Secondary | ICD-10-CM | POA: Diagnosis not present

## 2018-01-09 DIAGNOSIS — G894 Chronic pain syndrome: Secondary | ICD-10-CM | POA: Diagnosis not present

## 2018-01-17 DIAGNOSIS — M47812 Spondylosis without myelopathy or radiculopathy, cervical region: Secondary | ICD-10-CM | POA: Diagnosis not present

## 2018-01-30 DIAGNOSIS — M4802 Spinal stenosis, cervical region: Secondary | ICD-10-CM | POA: Diagnosis not present

## 2018-01-30 DIAGNOSIS — Z139 Encounter for screening, unspecified: Secondary | ICD-10-CM | POA: Diagnosis not present

## 2018-02-05 ENCOUNTER — Telehealth: Payer: Self-pay

## 2018-02-05 ENCOUNTER — Other Ambulatory Visit: Payer: Self-pay | Admitting: Chiropractic Medicine

## 2018-02-05 ENCOUNTER — Other Ambulatory Visit: Payer: Self-pay | Admitting: Physical Medicine and Rehabilitation

## 2018-02-05 DIAGNOSIS — M4802 Spinal stenosis, cervical region: Secondary | ICD-10-CM

## 2018-02-05 DIAGNOSIS — E041 Nontoxic single thyroid nodule: Secondary | ICD-10-CM | POA: Insufficient documentation

## 2018-02-05 DIAGNOSIS — A630 Anogenital (venereal) warts: Secondary | ICD-10-CM | POA: Insufficient documentation

## 2018-02-05 DIAGNOSIS — R011 Cardiac murmur, unspecified: Secondary | ICD-10-CM | POA: Insufficient documentation

## 2018-02-05 DIAGNOSIS — K219 Gastro-esophageal reflux disease without esophagitis: Secondary | ICD-10-CM | POA: Insufficient documentation

## 2018-02-05 DIAGNOSIS — E039 Hypothyroidism, unspecified: Secondary | ICD-10-CM | POA: Insufficient documentation

## 2018-02-05 DIAGNOSIS — M797 Fibromyalgia: Secondary | ICD-10-CM | POA: Insufficient documentation

## 2018-02-05 DIAGNOSIS — L900 Lichen sclerosus et atrophicus: Secondary | ICD-10-CM | POA: Insufficient documentation

## 2018-02-05 DIAGNOSIS — K635 Polyp of colon: Secondary | ICD-10-CM | POA: Insufficient documentation

## 2018-02-05 DIAGNOSIS — K589 Irritable bowel syndrome without diarrhea: Secondary | ICD-10-CM | POA: Insufficient documentation

## 2018-02-05 DIAGNOSIS — D259 Leiomyoma of uterus, unspecified: Secondary | ICD-10-CM | POA: Insufficient documentation

## 2018-02-05 DIAGNOSIS — M899 Disorder of bone, unspecified: Secondary | ICD-10-CM | POA: Insufficient documentation

## 2018-02-05 NOTE — Telephone Encounter (Signed)
Phone call to verify patient's medications list and allergies for cervical myelogram.  Patient instructed to hold Sertraline and Trazodone for 48 hours before and 24 hours after myelogram.  She verbalized an understanding of these instructions.  Brita Romp, RN

## 2018-02-07 ENCOUNTER — Ambulatory Visit
Admission: RE | Admit: 2018-02-07 | Discharge: 2018-02-07 | Disposition: A | Discharge status: Home / Self Care | Payer: Medicare HMO | Source: Ambulatory Visit | Attending: Chiropractic Medicine | Admitting: Chiropractic Medicine

## 2018-02-07 DIAGNOSIS — M48061 Spinal stenosis, lumbar region without neurogenic claudication: Secondary | ICD-10-CM | POA: Diagnosis not present

## 2018-02-07 DIAGNOSIS — M4802 Spinal stenosis, cervical region: Secondary | ICD-10-CM

## 2018-02-07 MED ORDER — DIAZEPAM 5 MG PO TABS
5.0000 mg | ORAL_TABLET | Freq: Once | ORAL | Status: AC
  Administered 2018-02-07: 10 mg via ORAL

## 2018-02-07 MED ORDER — ONDANSETRON HCL 4 MG/2ML IJ SOLN
4.0000 mg | Freq: Once | INTRAMUSCULAR | Status: AC
  Administered 2018-02-07: 4 mg via INTRAMUSCULAR

## 2018-02-07 MED ORDER — MEPERIDINE HCL 100 MG/ML IJ SOLN
50.0000 mg | Freq: Once | INTRAMUSCULAR | Status: AC
  Administered 2018-02-07: 50 mg via INTRAMUSCULAR

## 2018-02-07 MED ORDER — IOPAMIDOL (ISOVUE-M 300) INJECTION 61%
10.0000 mL | Freq: Once | INTRAMUSCULAR | Status: AC | PRN
  Administered 2018-02-07: 10 mL via INTRATHECAL

## 2018-02-07 MED ORDER — ONDANSETRON HCL 4 MG/2ML IJ SOLN: 4.0000 mg | Freq: Four times a day (QID) | INTRAMUSCULAR | Status: DC | PRN

## 2018-02-07 NOTE — Discharge Instructions (Signed)
Myelogram Discharge Instructions  1. Go home and rest quietly for the next 24 hours.  It is important to lie flat for the next 24 hours.  Get up only to go to the restroom.  You may lie in the bed or on a couch on your back, your stomach, your left side or your right side.  You may have one pillow under your head.  You may have pillows between your knees while you are on your side or under your knees while you are on your back.  2. DO NOT drive today.  Recline the seat as far back as it will go, while still wearing your seat belt, on the way home.  3. You may get up to go to the bathroom as needed.  You may sit up for 10 minutes to eat.  You may resume your normal diet and medications unless otherwise indicated.  Drink lots of extra fluids today and tomorrow.  4. The incidence of headache, nausea, or vomiting is about 5% (one in 20 patients).  If you develop a headache, lie flat and drink plenty of fluids until the headache goes away.  Caffeinated beverages may be helpful.  If you develop severe nausea and vomiting or a headache that does not go away with flat bed rest, call [phone].  5. You may resume normal activities after your 24 hours of bed rest is over; however, do not exert yourself strongly or do any heavy lifting tomorrow. If when you get up you have a headache when standing, go back to bed and force fluids for another 24 hours.  6. Call your physician for a follow-up appointment.  The results of your myelogram will be sent directly to your physician by the following day.  7. If you have any questions or if complications develop after you arrive home, please call [phone].  Discharge instructions have been explained to the patient.  The patient, or the person responsible for the patient, fully understands these instructions.   YOU MAY RESTART YOUR SERTRALINE AND ZOLOFT TOMORROW 02/08/2018 AT 09:30AM.

## 2018-03-05 DIAGNOSIS — R69 Illness, unspecified: Secondary | ICD-10-CM | POA: Diagnosis not present

## 2018-03-06 DIAGNOSIS — H52203 Unspecified astigmatism, bilateral: Secondary | ICD-10-CM | POA: Diagnosis not present

## 2018-03-06 DIAGNOSIS — H25013 Cortical age-related cataract, bilateral: Secondary | ICD-10-CM | POA: Diagnosis not present

## 2018-03-06 DIAGNOSIS — H5203 Hypermetropia, bilateral: Secondary | ICD-10-CM | POA: Diagnosis not present

## 2018-03-06 DIAGNOSIS — H524 Presbyopia: Secondary | ICD-10-CM | POA: Diagnosis not present

## 2018-03-06 DIAGNOSIS — H2513 Age-related nuclear cataract, bilateral: Secondary | ICD-10-CM | POA: Diagnosis not present

## 2018-03-19 DIAGNOSIS — D259 Leiomyoma of uterus, unspecified: Secondary | ICD-10-CM | POA: Diagnosis not present

## 2018-03-19 DIAGNOSIS — Z01411 Encounter for gynecological examination (general) (routine) with abnormal findings: Secondary | ICD-10-CM | POA: Diagnosis not present

## 2018-03-19 DIAGNOSIS — Z6824 Body mass index (BMI) 24.0-24.9, adult: Secondary | ICD-10-CM | POA: Diagnosis not present

## 2018-03-19 DIAGNOSIS — L900 Lichen sclerosus et atrophicus: Secondary | ICD-10-CM | POA: Diagnosis not present

## 2018-03-20 DIAGNOSIS — H2513 Age-related nuclear cataract, bilateral: Secondary | ICD-10-CM | POA: Diagnosis not present

## 2018-03-20 DIAGNOSIS — H25013 Cortical age-related cataract, bilateral: Secondary | ICD-10-CM | POA: Diagnosis not present

## 2018-03-25 DIAGNOSIS — Z961 Presence of intraocular lens: Secondary | ICD-10-CM | POA: Diagnosis not present

## 2018-03-27 DIAGNOSIS — H2512 Age-related nuclear cataract, left eye: Secondary | ICD-10-CM | POA: Diagnosis not present

## 2018-03-27 DIAGNOSIS — H25012 Cortical age-related cataract, left eye: Secondary | ICD-10-CM | POA: Diagnosis not present

## 2018-03-27 DIAGNOSIS — H25011 Cortical age-related cataract, right eye: Secondary | ICD-10-CM | POA: Diagnosis not present

## 2018-03-27 DIAGNOSIS — H2511 Age-related nuclear cataract, right eye: Secondary | ICD-10-CM | POA: Diagnosis not present

## 2018-04-03 DIAGNOSIS — H2511 Age-related nuclear cataract, right eye: Secondary | ICD-10-CM | POA: Diagnosis not present

## 2018-04-03 DIAGNOSIS — H25011 Cortical age-related cataract, right eye: Secondary | ICD-10-CM | POA: Diagnosis not present

## 2018-07-03 DIAGNOSIS — Z961 Presence of intraocular lens: Secondary | ICD-10-CM | POA: Diagnosis not present

## 2018-07-23 DIAGNOSIS — E039 Hypothyroidism, unspecified: Secondary | ICD-10-CM | POA: Diagnosis not present

## 2018-07-29 DIAGNOSIS — E039 Hypothyroidism, unspecified: Secondary | ICD-10-CM | POA: Diagnosis not present

## 2018-08-19 DIAGNOSIS — Z20828 Contact with and (suspected) exposure to other viral communicable diseases: Secondary | ICD-10-CM | POA: Diagnosis not present

## 2018-09-23 DIAGNOSIS — R69 Illness, unspecified: Secondary | ICD-10-CM | POA: Diagnosis not present

## 2018-09-26 ENCOUNTER — Other Ambulatory Visit: Payer: Self-pay | Admitting: Endocrinology

## 2018-09-26 DIAGNOSIS — E039 Hypothyroidism, unspecified: Secondary | ICD-10-CM

## 2018-10-04 ENCOUNTER — Ambulatory Visit
Admission: RE | Admit: 2018-10-04 | Discharge: 2018-10-04 | Disposition: A | Discharge status: Home / Self Care | Payer: Medicare HMO | Source: Ambulatory Visit | Attending: Endocrinology | Admitting: Endocrinology

## 2018-10-04 DIAGNOSIS — E039 Hypothyroidism, unspecified: Secondary | ICD-10-CM

## 2018-10-04 DIAGNOSIS — E041 Nontoxic single thyroid nodule: Secondary | ICD-10-CM | POA: Diagnosis not present

## 2018-10-20 DIAGNOSIS — R69 Illness, unspecified: Secondary | ICD-10-CM | POA: Diagnosis not present

## 2018-11-15 DIAGNOSIS — R69 Illness, unspecified: Secondary | ICD-10-CM | POA: Diagnosis not present

## 2018-11-15 DIAGNOSIS — Z1389 Encounter for screening for other disorder: Secondary | ICD-10-CM | POA: Diagnosis not present

## 2018-11-15 DIAGNOSIS — M62830 Muscle spasm of back: Secondary | ICD-10-CM | POA: Diagnosis not present

## 2018-11-15 DIAGNOSIS — E039 Hypothyroidism, unspecified: Secondary | ICD-10-CM | POA: Diagnosis not present

## 2018-11-15 DIAGNOSIS — Z0000 Encounter for general adult medical examination without abnormal findings: Secondary | ICD-10-CM | POA: Diagnosis not present

## 2018-11-15 DIAGNOSIS — M1990 Unspecified osteoarthritis, unspecified site: Secondary | ICD-10-CM | POA: Diagnosis not present

## 2018-11-15 DIAGNOSIS — Z131 Encounter for screening for diabetes mellitus: Secondary | ICD-10-CM | POA: Diagnosis not present

## 2018-11-15 DIAGNOSIS — L900 Lichen sclerosus et atrophicus: Secondary | ICD-10-CM | POA: Diagnosis not present

## 2018-11-15 DIAGNOSIS — G4700 Insomnia, unspecified: Secondary | ICD-10-CM | POA: Diagnosis not present

## 2018-11-15 DIAGNOSIS — M47812 Spondylosis without myelopathy or radiculopathy, cervical region: Secondary | ICD-10-CM | POA: Diagnosis not present

## 2018-11-19 ENCOUNTER — Encounter: Payer: Self-pay | Admitting: Obstetrics & Gynecology

## 2018-12-05 DIAGNOSIS — M47812 Spondylosis without myelopathy or radiculopathy, cervical region: Secondary | ICD-10-CM | POA: Diagnosis not present

## 2019-01-16 DIAGNOSIS — Z803 Family history of malignant neoplasm of breast: Secondary | ICD-10-CM | POA: Diagnosis not present

## 2019-01-16 DIAGNOSIS — Z1231 Encounter for screening mammogram for malignant neoplasm of breast: Secondary | ICD-10-CM | POA: Diagnosis not present

## 2019-01-20 DIAGNOSIS — Z961 Presence of intraocular lens: Secondary | ICD-10-CM | POA: Diagnosis not present

## 2019-01-20 DIAGNOSIS — Z0100 Encounter for examination of eyes and vision without abnormal findings: Secondary | ICD-10-CM | POA: Diagnosis not present

## 2019-01-20 DIAGNOSIS — R0602 Shortness of breath: Secondary | ICD-10-CM | POA: Diagnosis not present

## 2019-01-20 DIAGNOSIS — J849 Interstitial pulmonary disease, unspecified: Secondary | ICD-10-CM | POA: Diagnosis not present

## 2019-03-05 ENCOUNTER — Other Ambulatory Visit: Payer: Self-pay

## 2019-03-07 ENCOUNTER — Encounter: Payer: Self-pay | Admitting: Obstetrics & Gynecology

## 2019-03-07 ENCOUNTER — Ambulatory Visit (INDEPENDENT_AMBULATORY_CARE_PROVIDER_SITE_OTHER): Payer: Medicare HMO | Admitting: Obstetrics & Gynecology

## 2019-03-07 ENCOUNTER — Other Ambulatory Visit: Payer: Self-pay

## 2019-03-07 VITALS — BP 100/60 | HR 68 | Temp 97.3°F | Resp 12 | Ht 63.0 in | Wt 134.0 lb

## 2019-03-07 DIAGNOSIS — Z01419 Encounter for gynecological examination (general) (routine) without abnormal findings: Secondary | ICD-10-CM

## 2019-03-07 MED ORDER — CLOBETASOL PROPIONATE 0.05 % EX OINT: 1.0000 "application " | TOPICAL_OINTMENT | Freq: Two times a day (BID) | CUTANEOUS | 0 refills | Status: DC

## 2019-03-07 NOTE — Progress Notes (Signed)
69 y.o. G0P0 Married White or Caucasian female here as a new patient referred from Dr. Lady Deutscher for lichen sclerosis and annual gyn exam.  She was diagnosed via biopsy in 2017.  She was initially treated with clobetasol.  Uses OTC topical steroid ointment now and has minimal symptoms.  Denies vaginal bleeding.  Not on HRT.  No LMP recorded. Patient is postmenopausal.          Sexually active: No.  The current method of family planning is post menopausal status.    Exercising: Yes.    low impact, walking, treadmill, stationary bike Smoker:  no  Health Maintenance:  Pap:  02/14/16 Neg:Neg HR HPV History of abnormal Pap:  no MMG:  01/16/19 Colonoscopy:  01/01/17 f/u 5 years BMD:   04/19/16 Normal TDaP:  2016 per patient Pneumonia vaccine(s): completed Shingrix:   Zostavax  2013 Hep C testing: Negative in the past Screening Labs: PCP   reports that she has never smoked. She has never used smokeless tobacco. She reports previous alcohol use. She reports that she does not use drugs.  Past Medical History:  Diagnosis Date  . Allergy   . Anxiety   . Arthritis   . Asthma   . Blood transfusion   . Depression   . GERD (gastroesophageal reflux disease)    RESOLVED  . Heart murmur   . HPV in female 2013  . Mental disorder    depression  . Thyroid disease    hypothyroidism... previously been both    Past Surgical History:  Procedure Laterality Date  . BREAST REDUCTION SURGERY  1991  . CATARACT EXTRACTION, BILATERAL    . DILATION AND CURETTAGE OF UTERUS    . explant of spine stimulator  2012  . HERNIA REPAIR     RIH- 2005, Femoral hernia- 2007, [facility]- 2008  . LAMINECTOMY  2005   L4 and L5  . MENISCUS REPAIR Right   . POLYPECTOMY  03/2007   ENDOMETRIAL  . RADIAL OPTIC NEUROTOMY    . ROTATOR CUFF REPAIR  2004   right  . SEPTOPLASTY    . SPINE SURGERY  2007   spinal cord implant stimulator  . TONSILLECTOMY     as a child    Current Outpatient Medications  Medication Sig  Dispense Refill  . Ascorbic Acid (VITAMIN C) 100 MG tablet Take 100 mg by mouth daily.    . calcium carbonate 200 MG capsule Take 250 mg by mouth 2 (two) times daily with a meal.    . Cholecalciferol (VITAMIN D3 PO) Take by mouth daily.    . clonazePAM (KLONOPIN) 1 MG tablet Ad lib.    Marland Kitchen diclofenac sodium (VOLTAREN) 1 % GEL Apply topically 4 (four) times daily.    Marland Kitchen docusate sodium (COLACE) 100 MG capsule Take 100 mg by mouth 2 (two) times daily.    . hydrocortisone cream 1 % Apply 1 application topically 2 (two) times a week.    . levothyroxine (SYNTHROID, LEVOTHROID) 125 MCG tablet Take 125 mcg by mouth daily before breakfast.    . magnesium 30 MG tablet Take 30 mg by mouth 2 (two) times daily.    . Naproxen Sodium (ALEVE PO) Take by mouth.    . sertraline (ZOLOFT) 100 MG tablet Take 100 mg by mouth daily.    Marland Kitchen tiZANidine (ZANAFLEX) 4 MG capsule Take 4 mg by mouth as needed for muscle spasms.    . traZODone (DESYREL) 50 MG tablet Take 50 mg by mouth  at bedtime.    . vitamin E 100 UNIT capsule Take 100 Units by mouth daily.     No current facility-administered medications for this visit.    Family History  Problem Relation Age of Onset  . Cancer Mother        breast  . Dementia Mother   . Cancer Father        colon  . Cancer Paternal Aunt        BREAST  . Stroke Maternal Grandmother     Review of Systems  All other systems reviewed and are negative.   Exam:   BP 100/60 (BP Location: Right Arm, Patient Position: Sitting, Cuff Size: Normal)   Pulse 68   Temp (!) 97.3 F (36.3 C) (Temporal)   Resp 12   Ht 5\' 3"  (1.6 m)   Wt 134 lb (60.8 kg)   BMI 23.74 kg/m    Height: 5\' 3"  (160 cm)  Ht Readings from Last 3 Encounters:  03/07/19 5\' 3"  (1.6 m)  10/19/14 5' 3.5" (1.613 m)  02/09/12 5' 3.5" (1.613 m)    General appearance: alert, cooperative and appears stated age Head: Normocephalic, without obvious abnormality, atraumatic Neck: no adenopathy, supple, symmetrical,  trachea midline and thyroid normal to inspection and palpation Lungs: clear to auscultation bilaterally Breasts: normal appearance, no masses or tenderness Heart: regular rate and rhythm Abdomen: soft, non-tender; bowel sounds normal; no masses,  no organomegaly Extremities: extremities normal, atraumatic, no cyanosis or edema Skin: Skin color, texture, turgor normal. No rashes or lesions Lymph nodes: Cervical, supraclavicular, and axillary nodes normal. No abnormal inguinal nodes palpated Neurologic: Grossly normal   Pelvic: External genitalia:  no lesions              Urethra:  normal appearing urethra with no masses, tenderness or lesions              Bartholins and Skenes: normal                 Vagina: normal appearing vagina with normal color and discharge, no lesions              Cervix: no lesions              Pap taken: No. Bimanual Exam:  Uterus:  normal size, contour, position, consistency, mobility, non-tender              Adnexa: normal adnexa and no mass, fullness, tenderness               Rectovaginal: Confirms               Anus:  normal sphincter tone, no lesions  Chaperone, Terence Lux, CMA, was present for exam.  A:  Well Woman with normal exam PMP, no HRT H/O biopsy proven LS&A, under excellent control Hypothyroidism H/o asthma  P:   Mammogram guidelines reviewed pap smear guidelines reviewed.  Last pap neg with neg HR HPV 2017.  Not indicated today. RF for clobetasol 0.05% ointment bid x 7 days with flare.  Knows to call if using this with any frequency or symptoms do not resolve or have any non-healing locations Colonoscopy and BMD UTD Vaccines reviewed. return annually or prn

## 2019-03-12 ENCOUNTER — Encounter: Payer: Self-pay | Admitting: Obstetrics & Gynecology

## 2019-03-13 DIAGNOSIS — H524 Presbyopia: Secondary | ICD-10-CM | POA: Diagnosis not present

## 2019-03-13 DIAGNOSIS — H52203 Unspecified astigmatism, bilateral: Secondary | ICD-10-CM | POA: Diagnosis not present

## 2019-03-13 DIAGNOSIS — H5213 Myopia, bilateral: Secondary | ICD-10-CM | POA: Diagnosis not present

## 2019-03-24 ENCOUNTER — Ambulatory Visit: Payer: Medicare HMO | Attending: Internal Medicine

## 2019-03-24 DIAGNOSIS — Z23 Encounter for immunization: Secondary | ICD-10-CM | POA: Insufficient documentation

## 2019-03-24 NOTE — Progress Notes (Signed)
   Covid-19 Vaccination Clinic  Name:  NAVIKA GOGOLA    MRN: [id number] DOB: 01-07-1951  03/24/2019  Ms. Pietsch was observed post Covid-19 immunization for 30 minutes based on pre-vaccination screening without incidence. She was provided with Vaccine Information Sheet and instruction to access the V-Safe system.   Ms. Banta was instructed to call 911 with any severe reactions post vaccine: Marland Kitchen Difficulty breathing  . Swelling of your face and throat  . A fast heartbeat  . A bad rash all over your body  . Dizziness and weakness    Immunizations Administered    Name Date Dose VIS Date Route   Pfizer COVID-19 Vaccine 03/24/2019  4:37 PM 0.3 mL 02/07/2019 Intramuscular   Manufacturer: [place]   Lot: [id number]   [place]: [id number]

## 2019-03-31 DIAGNOSIS — R69 Illness, unspecified: Secondary | ICD-10-CM | POA: Diagnosis not present

## 2019-04-15 ENCOUNTER — Ambulatory Visit: Payer: Medicare HMO

## 2019-04-18 ENCOUNTER — Ambulatory Visit: Payer: Medicare HMO

## 2019-04-20 ENCOUNTER — Ambulatory Visit: Payer: Medicare HMO | Attending: Internal Medicine

## 2019-04-20 DIAGNOSIS — Z23 Encounter for immunization: Secondary | ICD-10-CM | POA: Insufficient documentation

## 2019-04-20 NOTE — Progress Notes (Signed)
   Covid-19 Vaccination Clinic  Name:  Tracey [name]    MRN: [id number] DOB: Nov 05, 1950  04/20/2019  [name] was observed post Covid-19 immunization for 15 minutes without incidence. She was provided with Vaccine Information Sheet and instruction to access the V-Safe system.   [name] was instructed to call 911 with any severe reactions post vaccine: Marland Kitchen Difficulty breathing  . Swelling of your face and throat  . A fast heartbeat  . A bad rash all over your body  . Dizziness and weakness    Immunizations Administered    Name Date Dose VIS Date Route   Pfizer COVID-19 Vaccine 04/20/2019 12:25 PM 0.3 mL 02/07/2019 Intramuscular   Manufacturer: [place]   Lot: [id number]   [place]: [id number]

## 2019-05-06 DIAGNOSIS — M47812 Spondylosis without myelopathy or radiculopathy, cervical region: Secondary | ICD-10-CM | POA: Diagnosis not present

## 2019-05-06 DIAGNOSIS — G894 Chronic pain syndrome: Secondary | ICD-10-CM | POA: Diagnosis not present

## 2019-05-19 DIAGNOSIS — R69 Illness, unspecified: Secondary | ICD-10-CM | POA: Diagnosis not present

## 2019-05-21 DIAGNOSIS — Z961 Presence of intraocular lens: Secondary | ICD-10-CM | POA: Diagnosis not present

## 2019-05-21 DIAGNOSIS — H00025 Hordeolum internum left lower eyelid: Secondary | ICD-10-CM | POA: Diagnosis not present

## 2019-07-23 DIAGNOSIS — E039 Hypothyroidism, unspecified: Secondary | ICD-10-CM | POA: Diagnosis not present

## 2019-07-29 DIAGNOSIS — E039 Hypothyroidism, unspecified: Secondary | ICD-10-CM | POA: Diagnosis not present

## 2019-07-29 DIAGNOSIS — E041 Nontoxic single thyroid nodule: Secondary | ICD-10-CM | POA: Diagnosis not present

## 2019-07-30 ENCOUNTER — Other Ambulatory Visit: Payer: Self-pay | Admitting: Endocrinology

## 2019-07-30 DIAGNOSIS — E039 Hypothyroidism, unspecified: Secondary | ICD-10-CM

## 2019-11-04 DIAGNOSIS — M4692 Unspecified inflammatory spondylopathy, cervical region: Secondary | ICD-10-CM | POA: Diagnosis not present

## 2019-11-04 DIAGNOSIS — M7061 Trochanteric bursitis, right hip: Secondary | ICD-10-CM | POA: Diagnosis not present

## 2019-11-04 DIAGNOSIS — G894 Chronic pain syndrome: Secondary | ICD-10-CM | POA: Diagnosis not present

## 2019-11-20 DIAGNOSIS — M47812 Spondylosis without myelopathy or radiculopathy, cervical region: Secondary | ICD-10-CM | POA: Diagnosis not present

## 2019-11-25 ENCOUNTER — Ambulatory Visit: Payer: Medicare HMO | Attending: Internal Medicine

## 2019-11-25 DIAGNOSIS — Z23 Encounter for immunization: Secondary | ICD-10-CM

## 2019-11-25 NOTE — Progress Notes (Signed)
   Covid-19 Vaccination Clinic  Name:  JEWELS LANGONE    MRN: [id number] DOB: August 13, 1950  11/25/2019  Ms. Dagostino was observed post Covid-19 immunization for 15 minutes without incident. She was provided with Vaccine Information Sheet and instruction to access the V-Safe system.   Ms. Beaumont was instructed to call 911 with any severe reactions post vaccine: Marland Kitchen Difficulty breathing  . Swelling of face and throat  . A fast heartbeat  . A bad rash all over body  . Dizziness and weakness

## 2019-12-01 DIAGNOSIS — M1990 Unspecified osteoarthritis, unspecified site: Secondary | ICD-10-CM | POA: Diagnosis not present

## 2019-12-01 DIAGNOSIS — E039 Hypothyroidism, unspecified: Secondary | ICD-10-CM | POA: Diagnosis not present

## 2019-12-01 DIAGNOSIS — G4700 Insomnia, unspecified: Secondary | ICD-10-CM | POA: Diagnosis not present

## 2019-12-01 DIAGNOSIS — L900 Lichen sclerosus et atrophicus: Secondary | ICD-10-CM | POA: Diagnosis not present

## 2019-12-01 DIAGNOSIS — R69 Illness, unspecified: Secondary | ICD-10-CM | POA: Diagnosis not present

## 2019-12-01 DIAGNOSIS — Z0000 Encounter for general adult medical examination without abnormal findings: Secondary | ICD-10-CM | POA: Diagnosis not present

## 2019-12-01 DIAGNOSIS — Z1389 Encounter for screening for other disorder: Secondary | ICD-10-CM | POA: Diagnosis not present

## 2019-12-01 DIAGNOSIS — Z131 Encounter for screening for diabetes mellitus: Secondary | ICD-10-CM | POA: Diagnosis not present

## 2019-12-29 DIAGNOSIS — R2241 Localized swelling, mass and lump, right lower limb: Secondary | ICD-10-CM | POA: Diagnosis not present

## 2019-12-29 DIAGNOSIS — M5030 Other cervical disc degeneration, unspecified cervical region: Secondary | ICD-10-CM | POA: Diagnosis not present

## 2019-12-31 DIAGNOSIS — M7061 Trochanteric bursitis, right hip: Secondary | ICD-10-CM | POA: Diagnosis not present

## 2019-12-31 DIAGNOSIS — M25851 Other specified joint disorders, right hip: Secondary | ICD-10-CM | POA: Diagnosis not present

## 2020-01-02 ENCOUNTER — Other Ambulatory Visit: Payer: Self-pay | Admitting: Orthopedic Surgery

## 2020-01-02 DIAGNOSIS — M25851 Other specified joint disorders, right hip: Secondary | ICD-10-CM

## 2020-01-03 DIAGNOSIS — Z23 Encounter for immunization: Secondary | ICD-10-CM | POA: Diagnosis not present

## 2020-01-05 ENCOUNTER — Other Ambulatory Visit: Payer: Self-pay

## 2020-01-05 ENCOUNTER — Ambulatory Visit
Admission: RE | Admit: 2020-01-05 | Discharge: 2020-01-05 | Disposition: A | Discharge status: Home / Self Care | Payer: Medicare HMO | Source: Ambulatory Visit | Attending: Orthopedic Surgery | Admitting: Orthopedic Surgery

## 2020-01-05 DIAGNOSIS — M25851 Other specified joint disorders, right hip: Secondary | ICD-10-CM

## 2020-01-05 DIAGNOSIS — M1611 Unilateral primary osteoarthritis, right hip: Secondary | ICD-10-CM | POA: Diagnosis not present

## 2020-01-05 DIAGNOSIS — M533 Sacrococcygeal disorders, not elsewhere classified: Secondary | ICD-10-CM | POA: Diagnosis not present

## 2020-01-05 DIAGNOSIS — S76311A Strain of muscle, fascia and tendon of the posterior muscle group at thigh level, right thigh, initial encounter: Secondary | ICD-10-CM | POA: Diagnosis not present

## 2020-01-05 DIAGNOSIS — M6258 Muscle wasting and atrophy, not elsewhere classified, other site: Secondary | ICD-10-CM | POA: Diagnosis not present

## 2020-01-19 DIAGNOSIS — M25551 Pain in right hip: Secondary | ICD-10-CM | POA: Diagnosis not present

## 2020-02-11 DIAGNOSIS — Z1231 Encounter for screening mammogram for malignant neoplasm of breast: Secondary | ICD-10-CM | POA: Diagnosis not present

## 2020-02-15 DIAGNOSIS — Z0100 Encounter for examination of eyes and vision without abnormal findings: Secondary | ICD-10-CM | POA: Diagnosis not present

## 2020-03-10 DIAGNOSIS — M7061 Trochanteric bursitis, right hip: Secondary | ICD-10-CM | POA: Diagnosis not present

## 2020-03-10 DIAGNOSIS — M25552 Pain in left hip: Secondary | ICD-10-CM | POA: Diagnosis not present

## 2020-03-10 DIAGNOSIS — M7062 Trochanteric bursitis, left hip: Secondary | ICD-10-CM | POA: Diagnosis not present

## 2020-03-22 DIAGNOSIS — M85832 Other specified disorders of bone density and structure, left forearm: Secondary | ICD-10-CM | POA: Diagnosis not present

## 2020-03-22 DIAGNOSIS — Z780 Asymptomatic menopausal state: Secondary | ICD-10-CM | POA: Diagnosis not present

## 2020-03-26 DIAGNOSIS — R1031 Right lower quadrant pain: Secondary | ICD-10-CM | POA: Diagnosis not present

## 2020-03-26 DIAGNOSIS — M799 Soft tissue disorder, unspecified: Secondary | ICD-10-CM | POA: Diagnosis not present

## 2020-03-26 DIAGNOSIS — M7070 Other bursitis of hip, unspecified hip: Secondary | ICD-10-CM | POA: Diagnosis not present

## 2020-03-26 DIAGNOSIS — Z803 Family history of malignant neoplasm of breast: Secondary | ICD-10-CM | POA: Diagnosis not present

## 2020-03-26 DIAGNOSIS — M8580 Other specified disorders of bone density and structure, unspecified site: Secondary | ICD-10-CM | POA: Diagnosis not present

## 2020-03-31 DIAGNOSIS — R1031 Right lower quadrant pain: Secondary | ICD-10-CM | POA: Diagnosis not present

## 2020-04-06 DIAGNOSIS — M5416 Radiculopathy, lumbar region: Secondary | ICD-10-CM | POA: Diagnosis not present

## 2020-04-06 DIAGNOSIS — M47812 Spondylosis without myelopathy or radiculopathy, cervical region: Secondary | ICD-10-CM | POA: Diagnosis not present

## 2020-05-03 DIAGNOSIS — M4312 Spondylolisthesis, cervical region: Secondary | ICD-10-CM | POA: Diagnosis not present

## 2020-05-03 DIAGNOSIS — M5136 Other intervertebral disc degeneration, lumbar region: Secondary | ICD-10-CM | POA: Diagnosis not present

## 2020-05-03 DIAGNOSIS — M5134 Other intervertebral disc degeneration, thoracic region: Secondary | ICD-10-CM | POA: Diagnosis not present

## 2020-05-03 DIAGNOSIS — T85192A Other mechanical complication of implanted electronic neurostimulator (electrode) of spinal cord, initial encounter: Secondary | ICD-10-CM | POA: Diagnosis not present

## 2020-05-03 DIAGNOSIS — Y758 Miscellaneous neurological devices associated with adverse incidents, not elsewhere classified: Secondary | ICD-10-CM | POA: Diagnosis not present

## 2020-05-03 DIAGNOSIS — Z9689 Presence of other specified functional implants: Secondary | ICD-10-CM | POA: Diagnosis not present

## 2020-05-03 DIAGNOSIS — M47816 Spondylosis without myelopathy or radiculopathy, lumbar region: Secondary | ICD-10-CM | POA: Diagnosis not present

## 2020-05-20 ENCOUNTER — Ambulatory Visit: Payer: Medicare HMO

## 2020-05-31 DIAGNOSIS — R922 Inconclusive mammogram: Secondary | ICD-10-CM | POA: Diagnosis not present

## 2020-05-31 DIAGNOSIS — Z800 Family history of malignant neoplasm of digestive organs: Secondary | ICD-10-CM | POA: Diagnosis not present

## 2020-05-31 DIAGNOSIS — Z803 Family history of malignant neoplasm of breast: Secondary | ICD-10-CM | POA: Diagnosis not present

## 2020-06-03 DIAGNOSIS — G894 Chronic pain syndrome: Secondary | ICD-10-CM | POA: Diagnosis not present

## 2020-06-03 DIAGNOSIS — T85192A Other mechanical complication of implanted electronic neurostimulator (electrode) of spinal cord, initial encounter: Secondary | ICD-10-CM | POA: Diagnosis not present

## 2020-06-03 DIAGNOSIS — Z803 Family history of malignant neoplasm of breast: Secondary | ICD-10-CM | POA: Diagnosis not present

## 2020-06-03 DIAGNOSIS — E039 Hypothyroidism, unspecified: Secondary | ICD-10-CM | POA: Diagnosis not present

## 2020-06-03 DIAGNOSIS — Z79890 Hormone replacement therapy (postmenopausal): Secondary | ICD-10-CM | POA: Diagnosis not present

## 2020-06-03 DIAGNOSIS — Z79899 Other long term (current) drug therapy: Secondary | ICD-10-CM | POA: Diagnosis not present

## 2020-06-03 DIAGNOSIS — M1711 Unilateral primary osteoarthritis, right knee: Secondary | ICD-10-CM | POA: Diagnosis not present

## 2020-06-03 DIAGNOSIS — Y831 Surgical operation with implant of artificial internal device as the cause of abnormal reaction of the patient, or of later complication, without mention of misadventure at the time of the procedure: Secondary | ICD-10-CM | POA: Diagnosis not present

## 2020-06-04 ENCOUNTER — Other Ambulatory Visit: Payer: Self-pay | Admitting: General Surgery

## 2020-06-04 DIAGNOSIS — Z803 Family history of malignant neoplasm of breast: Secondary | ICD-10-CM

## 2020-06-08 ENCOUNTER — Telehealth: Payer: Self-pay

## 2020-06-08 NOTE — Telephone Encounter (Signed)
I connected by phone with Tracey [name] and/or patient's caregiver on 06/08/2020 at 10:45 AM to discuss the potential vaccination through our Homebound vaccination initiative.   Prevaccination Checklist for COVID-19 Vaccines  1.  Are you feeling sick today? no  2.  Have you ever received a dose of a COVID-19 vaccine?  yes      If yes, which one? Pfizer   How many dose of Covid-19 vaccine have your received and dates ? 3, 03/24/2019, 04/20/2019, 11/25/2019   Check all that apply: I live in a long-term care setting. no  I have been diagnosed with a medical condition(s). Please list: _______________________ (pertinent to homebound status)  I am a first responder. no  I work in a long-term care facility, correctional facility, hospital, restaurant, retail setting, school, or other setting with high exposure to the public. no  4. Do you have a health condition or are you undergoing treatment that makes you moderately or severely immunocompromised? (This would include treatment for cancer or HIV, receipt of organ transplant, immunosuppressive therapy or high-dose corticosteroids, CAR-T-cell therapy, hematopoietic cell transplant [HCT], DiGeorge syndrome or Wiskott-Aldrich syndrome)  no  5. Have you received hematopoietic cell transplant (HCT) or CAR-T-cell therapies since receiving COVID-19 vaccine? no  6.  Have you ever had an allergic reaction: (This would include a severe reaction [ e.g., anaphylaxis] that required treatment with epinephrine or EpiPen or that caused you to go to the hospital.  It would also include an allergic reaction that occurred within 4 hours that caused hives, swelling, or respiratory distress, including wheezing.) A.  A previous dose of COVID-19 vaccine. no  B.  A vaccine or injectable therapy that contains multiple components, one of which is a COVID-19 vaccine component, but it is not known which component elicited the immediate reaction. no  C.  Are you allergic to  polyethylene glycol? no  D. Are you allergic to Polysorbate, which is found in some vaccines, film coated tablets and intravenous steroids?  no   7.  Have you ever had an allergic reaction to another vaccine (other than COVID-19 vaccine) or an injectable medication? (This would include a severe reaction [ e.g., anaphylaxis] that required treatment with epinephrine or EpiPen or that caused you to go to the hospital.  It would also include an allergic reaction that occurred within 4 hours that caused hives, swelling, or respiratory distress, including wheezing.)  no   8.  Have you ever had a severe allergic reaction (e.g., anaphylaxis) to something other than a component of the COVID-19 vaccine, or any vaccine or injectable medication?  This would include food, pet, venom, environmental, or oral medication allergies.  no   Check all that apply to you:  Am a female between ages 83 and 73 years old  no  Women 43 through 69 years of age can receive any FDA-authorized or -approved COVID-19 vaccine. However, they should be informed of the rare but increased risk of thrombosis with thrombocytopenia syndrome (TTS) after receipt of the [employer] Vaccine and the availability of other FDA-authorized and -approved COVID-19 vaccines. People who had TTS after a first dose of Janssen vaccine should not receive a subsequent dose of Janssen product    Am a female between ages 64 and 32 years old  no Males 5 through 70 years of age may receive the correct formulation of Pfizer-BioNTech COVID-19 vaccine. Males 18 and older can receive any FDA-authorized or -approved vaccine. However, people receiving an mRNA COVID-19 vaccine, especially  males 66 through 70 years of age and their parents/legal representative (when relevant), should be informed of the risk of developing myocarditis (an inflammation of the heart muscle) or pericarditis (inflammation of the lining around the heart) after receipt of an mRNA vaccine. The  risk of developing either myocarditis or pericarditis after vaccination is low, and lower than the risk of myocarditis associated with SARS-CoV-2 infection in adolescents and adults. Vaccine recipients should be counseled about the need to seek care if symptoms of myocarditis or pericarditis develop after vaccination     Have a history of myocarditis or pericarditis  no Myocarditis or pericarditis after receipt of the first dose of an mRNA COVID-19 vaccine series but before administration of the second dose  Experts advise that people who develop myocarditis or pericarditis after a dose of an mRNA COVID-19 vaccine not receive a subsequent dose of any COVID-19 vaccine, until additional safety data are available.  Administration of a subsequent dose of COVID-19 vaccine before safety data are available can be considered in certain circumstances after the episode of myocarditis or pericarditis has completely resolved. Until additional data are available, some experts recommend a Alphonsa Overall COVID-19 vaccine be considered instead of an mRNA COVID-19 vaccine. Decisions about proceeding with a subsequent dose should include a conversation between the patient, their parent/legal representative (when relevant), and their clinical team, which may include a cardiologist.    Have been treated with monoclonal antibodies or convalescent serum to prevent or treat COVID-19  no Vaccination should be offered to people regardless of history of prior symptomatic or asymptomatic SARS-CoV-2 infection. There is no recommended minimal interval between infection and vaccination.  However, vaccination should be deferred if a patient received monoclonal antibodies or convalescent serum as treatment for COVID-19 or for post-exposure prophylaxis. This is a precautionary measure until additional information becomes available, to avoid interference of the antibody treatment with vaccine-induced immune responses.  Defer COVID-19 vaccination  for 30 days when a passive antibody product was used for post-exposure prophylaxis.  Defer COVID-19 vaccination for 90 days when a passive antibody product was used to treat COVID-19.     Diagnosed with Multisystem Inflammatory Syndrome (MIS-C or MIS-A) after a COVID-19 infection  no It is unknown if people with a history of MIS-C or MIS-A are at risk for a dysregulated immune response to COVID-19 vaccination.  People with a history of MIS-C or MIS-A may choose to be vaccinated. Considerations for vaccination may include:   Clinical recovery from MIS-C or MIS-A, including return to normal cardiac function   Personal risk of severe acute COVID-19 (e.g., age, underlying conditions)   High or substantial community transmission of SARS-CoV-2 and personal increased risk of reinfection.   Timing of any immunomodulatory therapies (general best practice guidelines for immunization can be consulted for more information [email or web address])   It has been 90 days or more since their diagnosis of MIS-C   Onset of MIS-C occurred before any COVID-19 vaccination   A conversation between the patient, their guardian(s), and their clinical team or a specialist may assist with COVID-19 vaccination decisions. Healthcare providers and health departments may also request a consultation from the [place] at [email or web address].     Have a bleeding disorder  no Take a blood thinner  no As with all vaccines, any COVID-19 vaccine product may be given to these patients, if a physician familiar with the patient's bleeding risk determines that the vaccine can be administered intramuscularly with reasonable safety.  ACIP  recommends the following technique for intramuscular vaccination in patients with bleeding disorders or taking blood thinners: a fine-gauge needle (23-gauge or smaller  caliber) should be used for the vaccination, followed by firm pressure on the site, without rubbing, for at least 2 minutes.  People who regularly take aspirin or anticoagulants as part of their routine medications do not need to stop these medications prior to receipt of any COVID-19 vaccine.    Have a history of heparin-induced thrombocytopenia (HIT)  no Although the etiology of TTS associated with the Alphonsa Overall COVID-19 vaccine is unclear, it appears to be similar to another rare immune-mediated syndrome, heparin-induced thrombocytopenia (HIT). People with a history of an episode of an immune-mediated syndrome characterized by thrombosis and thrombocytopenia, such as HIT, should be offered a currently FDA-approved or FDA-authorized mRNA COVID-19 vaccine if it has been ?90 days since their TTS resolved. After 90 days, patients may be vaccinated with any currently FDA-approved or FDA-authorized COVID-19 vaccine, including Janssen COVID-19 Vaccine. However, people who developed TTS after their initial Alphonsa Overall vaccine should not receive a Janssen booster dose.  Experts believe the following factors do not make people more susceptible to TTS after receipt of the [employer]. People with these conditions can be vaccinated with any FDA-authorized or - approved COVID-19 vaccine, including the [employer] COVID-19 Vaccine:   A prior history of venous thromboembolism   Risk factors for venous thromboembolism (e.g., inherited or acquired thrombophilia including Factor V Leiden; prothrombin gene 20210A mutation; antiphospholipid syndrome; protein C, protein S or antithrombin deficiency   A prior history of other types of thromboses not associated with thrombocytopenia   Pregnancy, post-partum status, or receipt of hormonal contraceptives (e.g., combined oral contraceptives, patch, ring)   Additional recipient education materials can be found at [email or web address]-[email or web address]  vaccines/safety/JJUpdate.html.    Am currently pregnant or breastfeeding  no Vaccination is recommended for all people aged 52 years and older, including people that are:   Pregnant   Breastfeeding   Trying to get pregnant now or who might become pregnant in the future   Pregnant, breastfeeding, and post-partum people 22 through 70 years of age should be aware of the rare risk of TTS after receipt of the Alphonsa Overall COVID-19 Vaccine and the availability of other FDA-authorized or -approved COVID-19 vaccines (i.e., mRNA vaccines).    Have received dermal fillers  no FDA-authorized or -approved COVID-19 vaccines can be administered to people who have received injectable dermal fillers who have no contraindications for vaccination.  Infrequently, these people might experience temporary swelling at or near the site of filler injection (usually the face or lips) following administration of a dose of an mRNA COVID-19 vaccine. These people should be advised to contact their healthcare provider if swelling develops at or near the site of dermal filler following vaccination.     Have a history of Guillain-Barr Syndrome (GBS)  no People with a history of GBS can receive any FDA-authorized or -approved COVID-19 vaccine. However, given the possible association between the [employer] and an increased risk of GBS, a patient with a history of GBS and their clinical team should discuss the availability of mRNA vaccines to offer protection against COVID-19. The highest risk has been observed in men aged 15-64 years with symptoms of GBS beginning within 42 days after Alphonsa Overall COVID-19 vaccination.  People who had GBS after receiving Janssen vaccine should be made aware of the option to receive an mRNA COVID-19 vaccine booster at least 2 months (8 weeks) after the  Janssen dose. However, Alphonsa Overall vaccine may be used as a booster, particularly if GBS occurred more than 42 days after vaccination or was related  to a non-vaccine factor. Prior to booster vaccination, a conversation between the patient and their clinical team may assist with decisions about use of a COVID-19 booster dose, including the timing of administration     Postvaccination Observation Times for People without Contraindications to Covid 19 Vaccination.  30 minutes:  People with a history of: A contraindication to another type of COVID-19 vaccine product (i.e., mRNA or viral vector COVID-19 vaccines)   Immediate (within 4 hours of exposure) non-severe allergic reaction to a COVID-19 vaccine or injectable therapies   Anaphylaxis due to any cause   Immediate allergic reaction of any severity to a non-COVID-19 vaccine   15 minutes: All other people  This patient is a 70 y.o. female that meets the FDA criteria to receive homebound vaccination. Patient or parent/caregiver understands they have the option to accept or refuse homebound vaccination.  Patient passed the pre-screening checklist and would like to proceed with homebound vaccination.  Based on questionnaire above, I recommend the patient be observed for 15 minutes.  There are an estimated #0 other household members/caregivers who are also interested in receiving the vaccine.    The patient has been confirmed homebound and eligible for homebound vaccination with the considerations outlined above. I will send the patient's information to our scheduling team who will reach out to schedule the patient and potential caregiver/family members for homebound vaccination.    Tracey [name] 06/08/2020 10:45 AM

## 2020-06-09 ENCOUNTER — Ambulatory Visit: Payer: Medicare HMO

## 2020-06-14 DIAGNOSIS — M4312 Spondylolisthesis, cervical region: Secondary | ICD-10-CM | POA: Diagnosis not present

## 2020-06-14 DIAGNOSIS — Y838 Other surgical procedures as the cause of abnormal reaction of the patient, or of later complication, without mention of misadventure at the time of the procedure: Secondary | ICD-10-CM | POA: Diagnosis not present

## 2020-06-14 DIAGNOSIS — T85192S Other mechanical complication of implanted electronic neurostimulator (electrode) of spinal cord, sequela: Secondary | ICD-10-CM | POA: Diagnosis not present

## 2020-06-15 ENCOUNTER — Ambulatory Visit: Payer: Medicare HMO | Attending: Critical Care Medicine

## 2020-06-15 DIAGNOSIS — Z23 Encounter for immunization: Secondary | ICD-10-CM

## 2020-06-15 NOTE — Progress Notes (Signed)
   Covid-19 Vaccination Clinic  Name:  Tracey [name]    MRN: [id number] DOB: Mar 22, 1950  06/15/2020  Tracey [name] was observed post Covid-19 immunization for 15 minutes without incident. She was provided with Vaccine Information Sheet and instruction to access the V-Safe system.   Tracey [name] was instructed to call 911 with any severe reactions post vaccine: Marland Kitchen Difficulty breathing  . Swelling of face and throat  . A fast heartbeat  . A bad rash all over body  . Dizziness and weakness   Immunizations Administered    Name Date Dose VIS Date Route   PFIZER Comrnaty(Gray TOP) Covid-19 Vaccine 06/15/2020 11:46 AM 0.3 mL 02/05/2020 Intramuscular   Manufacturer: [place]   Lot: [id number]   NDC: [phone]

## 2020-06-30 ENCOUNTER — Ambulatory Visit: Payer: Medicare HMO

## 2020-07-01 NOTE — Telephone Encounter (Signed)
No further notes

## 2020-07-12 DIAGNOSIS — L821 Other seborrheic keratosis: Secondary | ICD-10-CM | POA: Diagnosis not present

## 2020-07-12 DIAGNOSIS — L820 Inflamed seborrheic keratosis: Secondary | ICD-10-CM | POA: Diagnosis not present

## 2020-07-12 DIAGNOSIS — L570 Actinic keratosis: Secondary | ICD-10-CM | POA: Diagnosis not present

## 2020-07-12 DIAGNOSIS — D225 Melanocytic nevi of trunk: Secondary | ICD-10-CM | POA: Diagnosis not present

## 2020-07-12 DIAGNOSIS — D3613 Benign neoplasm of peripheral nerves and autonomic nervous system of lower limb, including hip: Secondary | ICD-10-CM | POA: Diagnosis not present

## 2020-07-12 DIAGNOSIS — D1801 Hemangioma of skin and subcutaneous tissue: Secondary | ICD-10-CM | POA: Diagnosis not present

## 2020-07-22 DIAGNOSIS — E039 Hypothyroidism, unspecified: Secondary | ICD-10-CM | POA: Diagnosis not present

## 2020-07-22 DIAGNOSIS — E559 Vitamin D deficiency, unspecified: Secondary | ICD-10-CM | POA: Diagnosis not present

## 2020-07-29 DIAGNOSIS — E039 Hypothyroidism, unspecified: Secondary | ICD-10-CM | POA: Diagnosis not present

## 2020-07-29 DIAGNOSIS — E041 Nontoxic single thyroid nodule: Secondary | ICD-10-CM | POA: Diagnosis not present

## 2020-07-29 DIAGNOSIS — E559 Vitamin D deficiency, unspecified: Secondary | ICD-10-CM | POA: Diagnosis not present

## 2020-08-01 ENCOUNTER — Inpatient Hospital Stay: Admission: RE | Admit: 2020-08-01 | Payer: Medicare HMO | Source: Ambulatory Visit

## 2020-08-04 NOTE — Progress Notes (Signed)
70 y.o. G0P0 Married White or Caucasian female here for breast and pelvic exam.  Is scheduled for breast MRI due to family history of breast cancer and dense breast tissue.  Because of planned breast MRI, old spinal stimulator was removed at [facility].  Procedure was much more extensive than they thought it would be.  Pt reports she really had a lot of post op pain but she is much better now.  Is back to her regular walking.     Had recent thyroid testing showing TSH was very low and she was on too much medication.    Denies vaginal bleeding.  No LMP recorded. Patient is postmenopausal.          Sexually active: No.  H/O STD:  h/o condyloma acuminatum  Health Maintenance: PCP:  Dr. Kelton Pillar.  Last wellness appt was 11/2019.  Did blood work at that appt:  yes Vaccines are up to date:  yes Colonoscopy:  2018 with Dr. Collene Mares.  Will have this updated next year. MMG:  01/16/2019, Future appt 08/12/20 BMD:  04/19/16 Last pap smear:  02/14/2016. H/o abnormal pap smear:  no   reports that she has never smoked. She has never used smokeless tobacco. She reports current alcohol use. She reports that she does not use drugs.  Past Medical History:  Diagnosis Date   Allergy    Anxiety    Arthritis    Asthma    Blood transfusion    Depression    GERD (gastroesophageal reflux disease)    RESOLVED   Heart murmur    HPV in female 2013   Mental disorder    depression   Thyroid disease    hypothyroidism... previously been both    Past Surgical History:  Procedure Laterality Date   BREAST REDUCTION SURGERY  1991   CATARACT EXTRACTION, BILATERAL     DILATION AND CURETTAGE OF UTERUS     explant of spine stimulator  2012   HERNIA REPAIR     RIH- 2005, Femoral hernia- 2007, [facility]- 2008   LAMINECTOMY  2005   L4 and L5   LUMBAR SPINAL CORD SIMULATOR LEAD REMOVAL     MENISCUS REPAIR Right    POLYPECTOMY  03/2007   ENDOMETRIAL   RADIAL OPTIC NEUROTOMY     ROTATOR CUFF REPAIR  2004   right    SEPTOPLASTY     SPINE SURGERY  2007   spinal cord implant stimulator   TONSILLECTOMY     as a child    Current Outpatient Medications  Medication Sig Dispense Refill   Ascorbic Acid (VITAMIN C) 100 MG tablet Take 100 mg by mouth daily.     calcium carbonate 200 MG capsule Take 250 mg by mouth 2 (two) times daily with a meal.     celecoxib (CELEBREX) 200 MG capsule Take 200 mg by mouth 2 (two) times daily. PRN     Cholecalciferol (VITAMIN D3 PO) Take by mouth daily.     clonazePAM (KLONOPIN) 1 MG tablet Ad lib.     diclofenac sodium (VOLTAREN) 1 % GEL Apply topically 4 (four) times daily.     docusate sodium (COLACE) 100 MG capsule Take 100 mg by mouth 2 (two) times daily.     hydrocortisone cream 1 % Apply 1 application topically 2 (two) times a week.     levothyroxine (SYNTHROID, LEVOTHROID) 125 MCG tablet Take 125 mcg by mouth daily before breakfast. 112 mcg     magnesium 30 MG tablet Take  30 mg by mouth 2 (two) times daily.     sertraline (ZOLOFT) 100 MG tablet Take 100 mg by mouth daily.     tiZANidine (ZANAFLEX) 4 MG capsule Take 4 mg by mouth as needed for muscle spasms.     traZODone (DESYREL) 50 MG tablet Take 50 mg by mouth at bedtime.     vitamin E 100 UNIT capsule Take 100 Units by mouth daily.     clobetasol ointment (TEMOVATE) 5.63 % Apply 1 application topically 2 (two) times daily. Apply as directed twice daily 60 g 0   Naproxen Sodium (ALEVE PO) Take by mouth. (Patient not taking: Reported on 08/05/2020)     No current facility-administered medications for this visit.    Family History  Problem Relation Age of Onset   Dementia Mother    Breast cancer Mother    Cancer Father        colon   Cancer Paternal Aunt        BREAST   Stroke Maternal Grandmother     Review of Systems  Constitutional: Negative.   Gastrointestinal: Negative.   Genitourinary: Negative.    Exam:   BP 122/80   Pulse 63   Ht 5' 2.75" (1.594 m)   Wt 133 lb (60.3 kg)   BMI 23.75 kg/m    Height: 5' 2.75" (159.4 cm)  General appearance: alert, cooperative and appears stated age Breasts: normal appearance, no masses or tenderness Abdomen: soft, non-tender; bowel sounds normal; no masses,  no organomegaly Lymph nodes: Cervical, supraclavicular, and axillary nodes normal.  No abnormal inguinal nodes palpated Neurologic: Grossly normal  Pelvic: External genitalia:  no lesions              Urethra:  normal appearing urethra with no masses, tenderness or lesions              Bartholins and Skenes: normal                 Vagina: normal appearing vagina with atrophic changes and no discharge, no lesions              Cervix: no lesions              Pap taken: No. Bimanual Exam:  Uterus:  normal size, contour, position, consistency, mobility, non-tender              Adnexa: normal adnexa and no mass, fullness, tenderness               Rectovaginal: Confirms               Anus:  normal sphincter tone, no lesions  Chaperone, Octaviano Batty, CMA, was present for exam.  Assessment/Plan: 1. GYN exam for high-risk Medicare patient - pap guidelines reviewed.  Not obtained today. - MMG and BMD requested from [facility] - colonoscopy due next year - lab work done with Dr. Laurann Montana - vaccines updated today  2. Postmenopausal -no HRT  3. Lichen sclerosus et atrophicus - exam normal today.  Rx for clobetasol to pharmacy  4. H/o genital warts - remote hx

## 2020-08-05 ENCOUNTER — Encounter (HOSPITAL_BASED_OUTPATIENT_CLINIC_OR_DEPARTMENT_OTHER): Payer: Self-pay | Admitting: Obstetrics & Gynecology

## 2020-08-05 ENCOUNTER — Ambulatory Visit (INDEPENDENT_AMBULATORY_CARE_PROVIDER_SITE_OTHER): Payer: Medicare HMO | Admitting: Obstetrics & Gynecology

## 2020-08-05 ENCOUNTER — Other Ambulatory Visit: Payer: Self-pay

## 2020-08-05 VITALS — BP 122/80 | HR 63 | Ht 62.75 in | Wt 133.0 lb

## 2020-08-05 DIAGNOSIS — Z8619 Personal history of other infectious and parasitic diseases: Secondary | ICD-10-CM

## 2020-08-05 DIAGNOSIS — E7800 Pure hypercholesterolemia, unspecified: Secondary | ICD-10-CM | POA: Insufficient documentation

## 2020-08-05 DIAGNOSIS — K469 Unspecified abdominal hernia without obstruction or gangrene: Secondary | ICD-10-CM | POA: Insufficient documentation

## 2020-08-05 DIAGNOSIS — Z9189 Other specified personal risk factors, not elsewhere classified: Secondary | ICD-10-CM | POA: Diagnosis not present

## 2020-08-05 DIAGNOSIS — Z780 Asymptomatic menopausal state: Secondary | ICD-10-CM

## 2020-08-05 DIAGNOSIS — Z803 Family history of malignant neoplasm of breast: Secondary | ICD-10-CM

## 2020-08-05 DIAGNOSIS — F419 Anxiety disorder, unspecified: Secondary | ICD-10-CM | POA: Insufficient documentation

## 2020-08-05 DIAGNOSIS — L900 Lichen sclerosus et atrophicus: Secondary | ICD-10-CM | POA: Diagnosis not present

## 2020-08-05 MED ORDER — CLOBETASOL PROPIONATE 0.05 % EX OINT: 1.0000 "application " | TOPICAL_OINTMENT | Freq: Two times a day (BID) | CUTANEOUS | 0 refills | Status: DC

## 2020-08-15 ENCOUNTER — Other Ambulatory Visit: Payer: Self-pay

## 2020-08-15 ENCOUNTER — Ambulatory Visit
Admission: RE | Admit: 2020-08-15 | Discharge: 2020-08-15 | Disposition: A | Discharge status: Home / Self Care | Payer: Medicare HMO | Source: Ambulatory Visit | Attending: General Surgery | Admitting: General Surgery

## 2020-08-15 DIAGNOSIS — Z803 Family history of malignant neoplasm of breast: Secondary | ICD-10-CM

## 2020-08-15 DIAGNOSIS — R928 Other abnormal and inconclusive findings on diagnostic imaging of breast: Secondary | ICD-10-CM | POA: Diagnosis not present

## 2020-08-15 MED ORDER — GADOBUTROL 1 MMOL/ML IV SOLN
6.0000 mL | Freq: Once | INTRAVENOUS | Status: AC | PRN
  Administered 2020-08-15: 6 mL via INTRAVENOUS

## 2020-08-18 ENCOUNTER — Encounter (HOSPITAL_BASED_OUTPATIENT_CLINIC_OR_DEPARTMENT_OTHER): Payer: Self-pay | Admitting: Obstetrics & Gynecology

## 2020-08-24 ENCOUNTER — Encounter (HOSPITAL_BASED_OUTPATIENT_CLINIC_OR_DEPARTMENT_OTHER): Payer: Self-pay | Admitting: Obstetrics & Gynecology

## 2020-09-02 DIAGNOSIS — H26493 Other secondary cataract, bilateral: Secondary | ICD-10-CM | POA: Diagnosis not present

## 2020-09-02 DIAGNOSIS — H04123 Dry eye syndrome of bilateral lacrimal glands: Secondary | ICD-10-CM | POA: Diagnosis not present

## 2020-09-02 DIAGNOSIS — Z961 Presence of intraocular lens: Secondary | ICD-10-CM | POA: Diagnosis not present

## 2020-09-08 DIAGNOSIS — H26491 Other secondary cataract, right eye: Secondary | ICD-10-CM | POA: Diagnosis not present

## 2020-09-09 DIAGNOSIS — E039 Hypothyroidism, unspecified: Secondary | ICD-10-CM | POA: Diagnosis not present

## 2020-09-14 ENCOUNTER — Encounter (HOSPITAL_BASED_OUTPATIENT_CLINIC_OR_DEPARTMENT_OTHER): Payer: Self-pay

## 2020-09-16 ENCOUNTER — Other Ambulatory Visit (HOSPITAL_BASED_OUTPATIENT_CLINIC_OR_DEPARTMENT_OTHER): Payer: Self-pay | Admitting: Obstetrics & Gynecology

## 2020-09-16 DIAGNOSIS — E041 Nontoxic single thyroid nodule: Secondary | ICD-10-CM

## 2020-09-16 DIAGNOSIS — R946 Abnormal results of thyroid function studies: Secondary | ICD-10-CM

## 2020-09-22 DIAGNOSIS — H26492 Other secondary cataract, left eye: Secondary | ICD-10-CM | POA: Diagnosis not present

## 2020-10-29 DIAGNOSIS — Z803 Family history of malignant neoplasm of breast: Secondary | ICD-10-CM | POA: Diagnosis not present

## 2020-10-29 DIAGNOSIS — Z8249 Family history of ischemic heart disease and other diseases of the circulatory system: Secondary | ICD-10-CM | POA: Diagnosis not present

## 2020-10-29 DIAGNOSIS — E042 Nontoxic multinodular goiter: Secondary | ICD-10-CM | POA: Diagnosis not present

## 2020-10-29 DIAGNOSIS — E039 Hypothyroidism, unspecified: Secondary | ICD-10-CM | POA: Diagnosis not present

## 2020-10-29 DIAGNOSIS — Z8349 Family history of other endocrine, nutritional and metabolic diseases: Secondary | ICD-10-CM | POA: Diagnosis not present

## 2020-11-11 ENCOUNTER — Ambulatory Visit: Payer: Medicare HMO

## 2020-11-18 ENCOUNTER — Ambulatory Visit: Payer: Medicare HMO | Attending: Internal Medicine

## 2020-11-18 ENCOUNTER — Other Ambulatory Visit (HOSPITAL_BASED_OUTPATIENT_CLINIC_OR_DEPARTMENT_OTHER): Payer: Self-pay

## 2020-11-18 DIAGNOSIS — Z23 Encounter for immunization: Secondary | ICD-10-CM

## 2020-11-18 MED ORDER — PFIZER-BIONT COVID-19 VAC-TRIS 30 MCG/0.3ML IM SUSP
INTRAMUSCULAR | 0 refills | Status: DC
  Filled 2020-11-18: qty 0.3, 1d supply, fill #0

## 2020-11-18 NOTE — Progress Notes (Signed)
   Covid-19 Vaccination Clinic  Name:  Tracey [name]    MRN: [id number] DOB: May 23, 1950  11/18/2020  [name] was observed post Covid-19 immunization for 15 minutes without incident. She was provided with Vaccine Information Sheet and instruction to access the V-Safe system.   [name] was instructed to call 911 with any severe reactions post vaccine: Difficulty breathing  Swelling of face and throat  A fast heartbeat  A bad rash all over body  Dizziness and weakness

## 2020-12-01 DIAGNOSIS — Z23 Encounter for immunization: Secondary | ICD-10-CM | POA: Diagnosis not present

## 2020-12-29 DIAGNOSIS — Z0000 Encounter for general adult medical examination without abnormal findings: Secondary | ICD-10-CM | POA: Diagnosis not present

## 2020-12-29 DIAGNOSIS — E7800 Pure hypercholesterolemia, unspecified: Secondary | ICD-10-CM | POA: Diagnosis not present

## 2020-12-29 DIAGNOSIS — L900 Lichen sclerosus et atrophicus: Secondary | ICD-10-CM | POA: Diagnosis not present

## 2020-12-29 DIAGNOSIS — G4700 Insomnia, unspecified: Secondary | ICD-10-CM | POA: Diagnosis not present

## 2020-12-29 DIAGNOSIS — E039 Hypothyroidism, unspecified: Secondary | ICD-10-CM | POA: Diagnosis not present

## 2020-12-29 DIAGNOSIS — E559 Vitamin D deficiency, unspecified: Secondary | ICD-10-CM | POA: Diagnosis not present

## 2020-12-29 DIAGNOSIS — Z1389 Encounter for screening for other disorder: Secondary | ICD-10-CM | POA: Diagnosis not present

## 2020-12-29 DIAGNOSIS — Z1331 Encounter for screening for depression: Secondary | ICD-10-CM | POA: Diagnosis not present

## 2020-12-29 DIAGNOSIS — M8580 Other specified disorders of bone density and structure, unspecified site: Secondary | ICD-10-CM | POA: Diagnosis not present

## 2020-12-29 DIAGNOSIS — R69 Illness, unspecified: Secondary | ICD-10-CM | POA: Diagnosis not present

## 2020-12-29 DIAGNOSIS — E042 Nontoxic multinodular goiter: Secondary | ICD-10-CM | POA: Diagnosis not present

## 2020-12-29 DIAGNOSIS — F419 Anxiety disorder, unspecified: Secondary | ICD-10-CM | POA: Diagnosis not present

## 2020-12-29 DIAGNOSIS — M1990 Unspecified osteoarthritis, unspecified site: Secondary | ICD-10-CM | POA: Diagnosis not present

## 2021-01-27 DIAGNOSIS — Z0100 Encounter for examination of eyes and vision without abnormal findings: Secondary | ICD-10-CM | POA: Diagnosis not present

## 2021-02-05 DIAGNOSIS — Z803 Family history of malignant neoplasm of breast: Secondary | ICD-10-CM | POA: Diagnosis not present

## 2021-02-05 DIAGNOSIS — M8580 Other specified disorders of bone density and structure, unspecified site: Secondary | ICD-10-CM | POA: Diagnosis not present

## 2021-02-05 DIAGNOSIS — Z800 Family history of malignant neoplasm of digestive organs: Secondary | ICD-10-CM | POA: Diagnosis not present

## 2021-02-05 DIAGNOSIS — F419 Anxiety disorder, unspecified: Secondary | ICD-10-CM | POA: Diagnosis not present

## 2021-02-05 DIAGNOSIS — E039 Hypothyroidism, unspecified: Secondary | ICD-10-CM | POA: Diagnosis not present

## 2021-02-05 DIAGNOSIS — R69 Illness, unspecified: Secondary | ICD-10-CM | POA: Diagnosis not present

## 2021-02-05 DIAGNOSIS — G4700 Insomnia, unspecified: Secondary | ICD-10-CM | POA: Diagnosis not present

## 2021-02-05 DIAGNOSIS — Z8249 Family history of ischemic heart disease and other diseases of the circulatory system: Secondary | ICD-10-CM | POA: Diagnosis not present

## 2021-02-05 DIAGNOSIS — M1990 Unspecified osteoarthritis, unspecified site: Secondary | ICD-10-CM | POA: Diagnosis not present

## 2021-02-05 DIAGNOSIS — G8929 Other chronic pain: Secondary | ICD-10-CM | POA: Diagnosis not present

## 2021-02-05 DIAGNOSIS — E785 Hyperlipidemia, unspecified: Secondary | ICD-10-CM | POA: Diagnosis not present

## 2021-02-05 DIAGNOSIS — M4800 Spinal stenosis, site unspecified: Secondary | ICD-10-CM | POA: Diagnosis not present

## 2021-02-16 DIAGNOSIS — Z1231 Encounter for screening mammogram for malignant neoplasm of breast: Secondary | ICD-10-CM | POA: Diagnosis not present

## 2021-03-04 DIAGNOSIS — R922 Inconclusive mammogram: Secondary | ICD-10-CM | POA: Diagnosis not present

## 2021-03-25 DIAGNOSIS — R519 Headache, unspecified: Secondary | ICD-10-CM | POA: Diagnosis not present

## 2021-03-25 DIAGNOSIS — U071 COVID-19: Secondary | ICD-10-CM | POA: Diagnosis not present

## 2021-03-25 DIAGNOSIS — R059 Cough, unspecified: Secondary | ICD-10-CM | POA: Diagnosis not present

## 2021-03-25 DIAGNOSIS — R509 Fever, unspecified: Secondary | ICD-10-CM | POA: Diagnosis not present

## 2021-04-01 DIAGNOSIS — Z20822 Contact with and (suspected) exposure to covid-19: Secondary | ICD-10-CM | POA: Diagnosis not present

## 2021-05-17 DIAGNOSIS — J3489 Other specified disorders of nose and nasal sinuses: Secondary | ICD-10-CM | POA: Diagnosis not present

## 2021-05-17 DIAGNOSIS — R059 Cough, unspecified: Secondary | ICD-10-CM | POA: Diagnosis not present

## 2021-05-17 DIAGNOSIS — J069 Acute upper respiratory infection, unspecified: Secondary | ICD-10-CM | POA: Diagnosis not present

## 2021-06-28 DIAGNOSIS — E039 Hypothyroidism, unspecified: Secondary | ICD-10-CM | POA: Diagnosis not present

## 2021-06-28 DIAGNOSIS — M25562 Pain in left knee: Secondary | ICD-10-CM | POA: Diagnosis not present

## 2021-07-18 DIAGNOSIS — L821 Other seborrheic keratosis: Secondary | ICD-10-CM | POA: Diagnosis not present

## 2021-07-18 DIAGNOSIS — I788 Other diseases of capillaries: Secondary | ICD-10-CM | POA: Diagnosis not present

## 2021-07-18 DIAGNOSIS — D1722 Benign lipomatous neoplasm of skin and subcutaneous tissue of left arm: Secondary | ICD-10-CM | POA: Diagnosis not present

## 2021-07-18 DIAGNOSIS — D1801 Hemangioma of skin and subcutaneous tissue: Secondary | ICD-10-CM | POA: Diagnosis not present

## 2021-07-18 DIAGNOSIS — L812 Freckles: Secondary | ICD-10-CM | POA: Diagnosis not present

## 2021-07-18 DIAGNOSIS — L570 Actinic keratosis: Secondary | ICD-10-CM | POA: Diagnosis not present

## 2021-07-18 DIAGNOSIS — D225 Melanocytic nevi of trunk: Secondary | ICD-10-CM | POA: Diagnosis not present

## 2021-08-08 DIAGNOSIS — K068 Other specified disorders of gingiva and edentulous alveolar ridge: Secondary | ICD-10-CM | POA: Diagnosis not present

## 2021-08-08 DIAGNOSIS — H00012 Hordeolum externum right lower eyelid: Secondary | ICD-10-CM | POA: Diagnosis not present

## 2021-08-08 DIAGNOSIS — R233 Spontaneous ecchymoses: Secondary | ICD-10-CM | POA: Diagnosis not present

## 2021-08-08 DIAGNOSIS — F419 Anxiety disorder, unspecified: Secondary | ICD-10-CM | POA: Diagnosis not present

## 2021-08-08 DIAGNOSIS — M8580 Other specified disorders of bone density and structure, unspecified site: Secondary | ICD-10-CM | POA: Diagnosis not present

## 2021-08-08 DIAGNOSIS — E559 Vitamin D deficiency, unspecified: Secondary | ICD-10-CM | POA: Diagnosis not present

## 2021-08-12 ENCOUNTER — Ambulatory Visit (HOSPITAL_BASED_OUTPATIENT_CLINIC_OR_DEPARTMENT_OTHER): Payer: Medicare HMO | Admitting: Obstetrics & Gynecology

## 2021-08-23 DIAGNOSIS — M47812 Spondylosis without myelopathy or radiculopathy, cervical region: Secondary | ICD-10-CM | POA: Diagnosis not present

## 2021-09-08 DIAGNOSIS — H52203 Unspecified astigmatism, bilateral: Secondary | ICD-10-CM | POA: Diagnosis not present

## 2021-09-08 DIAGNOSIS — H524 Presbyopia: Secondary | ICD-10-CM | POA: Diagnosis not present

## 2021-09-08 DIAGNOSIS — Z961 Presence of intraocular lens: Secondary | ICD-10-CM | POA: Diagnosis not present

## 2021-09-22 DIAGNOSIS — M47812 Spondylosis without myelopathy or radiculopathy, cervical region: Secondary | ICD-10-CM | POA: Diagnosis not present

## 2021-10-13 DIAGNOSIS — M47812 Spondylosis without myelopathy or radiculopathy, cervical region: Secondary | ICD-10-CM | POA: Diagnosis not present

## 2021-10-19 ENCOUNTER — Ambulatory Visit (HOSPITAL_BASED_OUTPATIENT_CLINIC_OR_DEPARTMENT_OTHER): Payer: PPO | Admitting: Obstetrics & Gynecology

## 2021-11-24 DIAGNOSIS — F419 Anxiety disorder, unspecified: Secondary | ICD-10-CM | POA: Diagnosis not present

## 2021-11-24 DIAGNOSIS — Z800 Family history of malignant neoplasm of digestive organs: Secondary | ICD-10-CM | POA: Diagnosis not present

## 2021-11-24 DIAGNOSIS — F32A Depression, unspecified: Secondary | ICD-10-CM | POA: Diagnosis not present

## 2021-11-24 DIAGNOSIS — K5904 Chronic idiopathic constipation: Secondary | ICD-10-CM | POA: Diagnosis not present

## 2021-11-24 DIAGNOSIS — Z1211 Encounter for screening for malignant neoplasm of colon: Secondary | ICD-10-CM | POA: Diagnosis not present

## 2021-11-25 DIAGNOSIS — M47812 Spondylosis without myelopathy or radiculopathy, cervical region: Secondary | ICD-10-CM | POA: Diagnosis not present

## 2021-12-06 DIAGNOSIS — E042 Nontoxic multinodular goiter: Secondary | ICD-10-CM | POA: Diagnosis not present

## 2021-12-06 DIAGNOSIS — E7800 Pure hypercholesterolemia, unspecified: Secondary | ICD-10-CM | POA: Diagnosis not present

## 2021-12-06 DIAGNOSIS — E039 Hypothyroidism, unspecified: Secondary | ICD-10-CM | POA: Diagnosis not present

## 2021-12-06 DIAGNOSIS — F419 Anxiety disorder, unspecified: Secondary | ICD-10-CM | POA: Diagnosis not present

## 2021-12-06 DIAGNOSIS — Z13820 Encounter for screening for osteoporosis: Secondary | ICD-10-CM | POA: Diagnosis not present

## 2021-12-06 DIAGNOSIS — M47812 Spondylosis without myelopathy or radiculopathy, cervical region: Secondary | ICD-10-CM | POA: Diagnosis not present

## 2021-12-06 DIAGNOSIS — M8580 Other specified disorders of bone density and structure, unspecified site: Secondary | ICD-10-CM | POA: Diagnosis not present

## 2021-12-06 DIAGNOSIS — Z23 Encounter for immunization: Secondary | ICD-10-CM | POA: Diagnosis not present

## 2021-12-06 DIAGNOSIS — D1722 Benign lipomatous neoplasm of skin and subcutaneous tissue of left arm: Secondary | ICD-10-CM | POA: Diagnosis not present

## 2021-12-06 DIAGNOSIS — Z0000 Encounter for general adult medical examination without abnormal findings: Secondary | ICD-10-CM | POA: Diagnosis not present

## 2021-12-06 DIAGNOSIS — M797 Fibromyalgia: Secondary | ICD-10-CM | POA: Diagnosis not present

## 2021-12-06 DIAGNOSIS — M1990 Unspecified osteoarthritis, unspecified site: Secondary | ICD-10-CM | POA: Diagnosis not present

## 2021-12-11 DIAGNOSIS — M47812 Spondylosis without myelopathy or radiculopathy, cervical region: Secondary | ICD-10-CM | POA: Diagnosis not present

## 2021-12-16 ENCOUNTER — Other Ambulatory Visit (HOSPITAL_BASED_OUTPATIENT_CLINIC_OR_DEPARTMENT_OTHER): Payer: Self-pay

## 2021-12-16 MED ORDER — COVID-19 MRNA VAC-TRIS(PFIZER) 30 MCG/0.3ML IM SUSY
PREFILLED_SYRINGE | INTRAMUSCULAR | 0 refills | Status: DC
  Filled 2021-12-16: qty 0.3, 1d supply, fill #0

## 2021-12-23 DIAGNOSIS — M2041 Other hammer toe(s) (acquired), right foot: Secondary | ICD-10-CM | POA: Diagnosis not present

## 2021-12-23 DIAGNOSIS — M79671 Pain in right foot: Secondary | ICD-10-CM | POA: Diagnosis not present

## 2021-12-23 DIAGNOSIS — M2042 Other hammer toe(s) (acquired), left foot: Secondary | ICD-10-CM | POA: Diagnosis not present

## 2021-12-23 DIAGNOSIS — L84 Corns and callosities: Secondary | ICD-10-CM | POA: Diagnosis not present

## 2021-12-29 ENCOUNTER — Other Ambulatory Visit (HOSPITAL_BASED_OUTPATIENT_CLINIC_OR_DEPARTMENT_OTHER): Payer: Self-pay

## 2021-12-29 MED ORDER — AREXVY 120 MCG/0.5ML IM SUSR
INTRAMUSCULAR | 0 refills | Status: DC
  Filled 2021-12-29: qty 0.5, 1d supply, fill #0

## 2022-01-11 DIAGNOSIS — M47812 Spondylosis without myelopathy or radiculopathy, cervical region: Secondary | ICD-10-CM | POA: Diagnosis not present

## 2022-01-11 DIAGNOSIS — G894 Chronic pain syndrome: Secondary | ICD-10-CM | POA: Diagnosis not present

## 2022-01-11 DIAGNOSIS — M542 Cervicalgia: Secondary | ICD-10-CM | POA: Diagnosis not present

## 2022-02-13 DIAGNOSIS — K5904 Chronic idiopathic constipation: Secondary | ICD-10-CM | POA: Diagnosis not present

## 2022-02-13 DIAGNOSIS — Z1211 Encounter for screening for malignant neoplasm of colon: Secondary | ICD-10-CM | POA: Diagnosis not present

## 2022-02-13 DIAGNOSIS — Z800 Family history of malignant neoplasm of digestive organs: Secondary | ICD-10-CM | POA: Diagnosis not present

## 2022-02-28 DIAGNOSIS — M47812 Spondylosis without myelopathy or radiculopathy, cervical region: Secondary | ICD-10-CM | POA: Diagnosis not present

## 2022-02-28 DIAGNOSIS — M542 Cervicalgia: Secondary | ICD-10-CM | POA: Diagnosis not present

## 2022-03-04 DIAGNOSIS — Z0100 Encounter for examination of eyes and vision without abnormal findings: Secondary | ICD-10-CM | POA: Diagnosis not present

## 2022-03-14 DIAGNOSIS — E7800 Pure hypercholesterolemia, unspecified: Secondary | ICD-10-CM | POA: Diagnosis not present

## 2022-04-17 DIAGNOSIS — Z1231 Encounter for screening mammogram for malignant neoplasm of breast: Secondary | ICD-10-CM | POA: Diagnosis not present

## 2022-04-17 DIAGNOSIS — Z780 Asymptomatic menopausal state: Secondary | ICD-10-CM | POA: Diagnosis not present

## 2022-04-17 DIAGNOSIS — M8589 Other specified disorders of bone density and structure, multiple sites: Secondary | ICD-10-CM | POA: Diagnosis not present

## 2022-04-24 DIAGNOSIS — D1801 Hemangioma of skin and subcutaneous tissue: Secondary | ICD-10-CM | POA: Diagnosis not present

## 2022-04-24 DIAGNOSIS — L821 Other seborrheic keratosis: Secondary | ICD-10-CM | POA: Diagnosis not present

## 2022-04-24 DIAGNOSIS — D225 Melanocytic nevi of trunk: Secondary | ICD-10-CM | POA: Diagnosis not present

## 2022-04-24 DIAGNOSIS — L812 Freckles: Secondary | ICD-10-CM | POA: Diagnosis not present

## 2022-05-11 DIAGNOSIS — H44811 Hemophthalmos, right eye: Secondary | ICD-10-CM | POA: Diagnosis not present

## 2022-05-11 DIAGNOSIS — S50861A Insect bite (nonvenomous) of right forearm, initial encounter: Secondary | ICD-10-CM | POA: Diagnosis not present

## 2022-05-11 DIAGNOSIS — R69 Illness, unspecified: Secondary | ICD-10-CM | POA: Diagnosis not present

## 2022-05-11 DIAGNOSIS — W57XXXA Bitten or stung by nonvenomous insect and other nonvenomous arthropods, initial encounter: Secondary | ICD-10-CM | POA: Diagnosis not present

## 2022-05-14 DIAGNOSIS — Z0100 Encounter for examination of eyes and vision without abnormal findings: Secondary | ICD-10-CM | POA: Diagnosis not present

## 2022-06-07 DIAGNOSIS — E039 Hypothyroidism, unspecified: Secondary | ICD-10-CM | POA: Diagnosis not present

## 2022-06-07 DIAGNOSIS — M19041 Primary osteoarthritis, right hand: Secondary | ICD-10-CM | POA: Diagnosis not present

## 2022-06-07 DIAGNOSIS — M1990 Unspecified osteoarthritis, unspecified site: Secondary | ICD-10-CM | POA: Diagnosis not present

## 2022-06-07 DIAGNOSIS — G4700 Insomnia, unspecified: Secondary | ICD-10-CM | POA: Diagnosis not present

## 2022-06-07 DIAGNOSIS — E7800 Pure hypercholesterolemia, unspecified: Secondary | ICD-10-CM | POA: Diagnosis not present

## 2022-06-07 DIAGNOSIS — M7032 Other bursitis of elbow, left elbow: Secondary | ICD-10-CM | POA: Diagnosis not present

## 2022-06-12 DIAGNOSIS — M25522 Pain in left elbow: Secondary | ICD-10-CM | POA: Diagnosis not present

## 2022-06-14 IMAGING — CT CT HIP*R* W/O CM
1 series · 15 of 32 positions shown, 19 images · non-contrast
Comparison: Postmyelogram CT scan 04/15/2003.

CLINICAL DATA: The patient reports a knot on the right thigh for 2
years which has increased in size over the past 4-6 months. No known
injury.

EXAM:
CT OF THE RIGHT HIP WITHOUT CONTRAST
TECHNIQUE: Multidetector CT imaging of the right hip was performed according to
the standard protocol. Multiplanar CT image reconstructions were
also generated.

[Series 4: soft tissue pelvis/hip · axial · 0.41mm/px · z∈[-269,-41]mm · 15 of 85 slices shown, 19 images]
[im 6/85  soft-tissue]
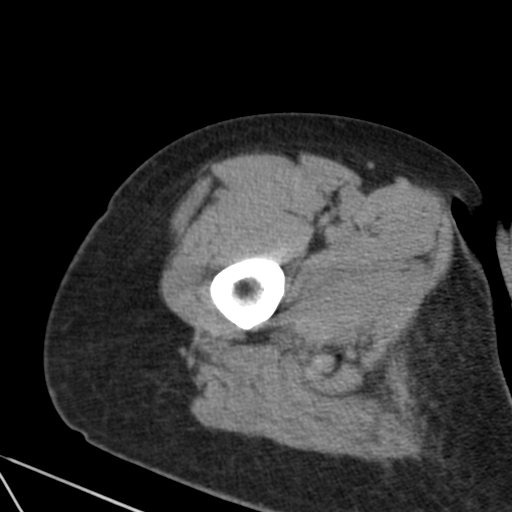
[im 6/85  bone]
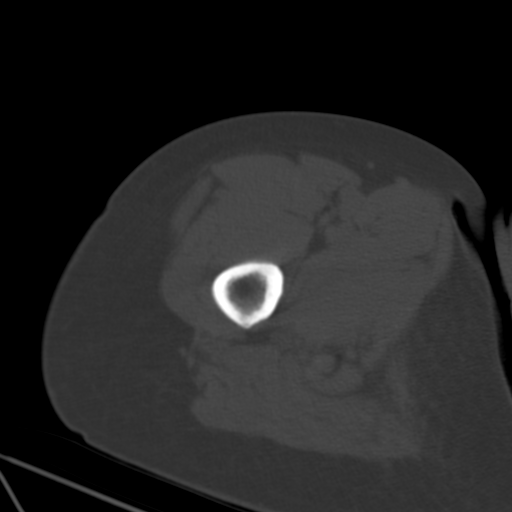
[im 11/85  soft-tissue]
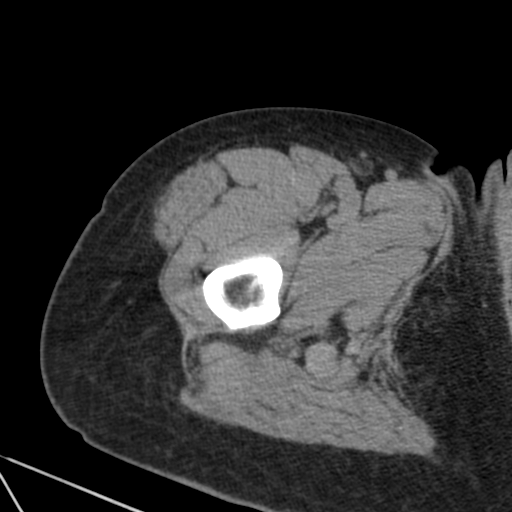
[im 17/85  soft-tissue]
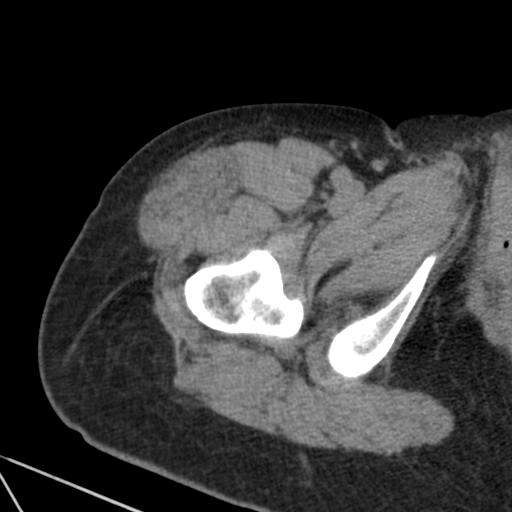
[im 25/85  soft-tissue]
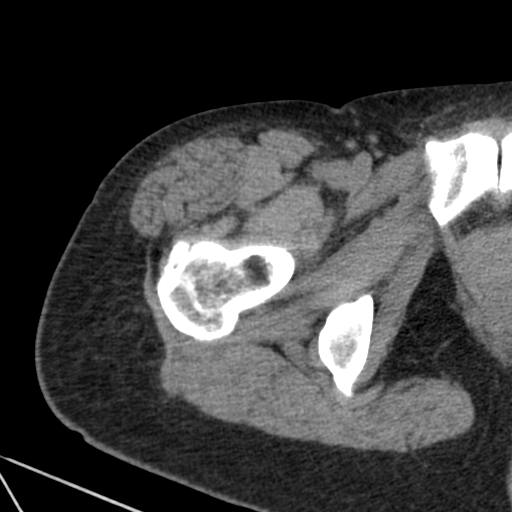
[im 30/85  soft-tissue]
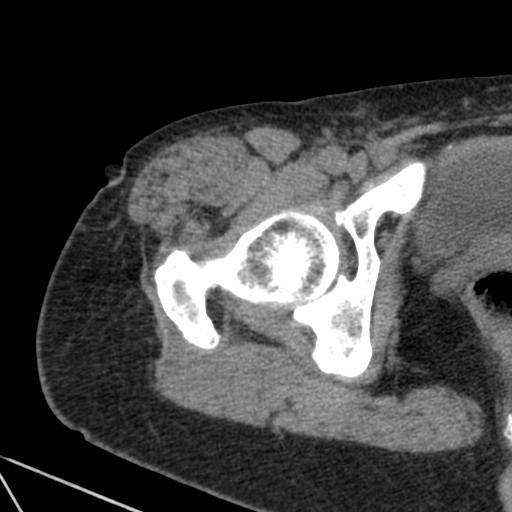
[im 36/85  soft-tissue]
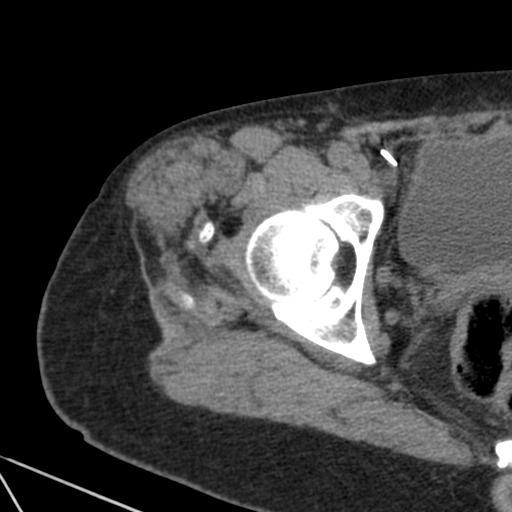
[im 44/85  soft-tissue]
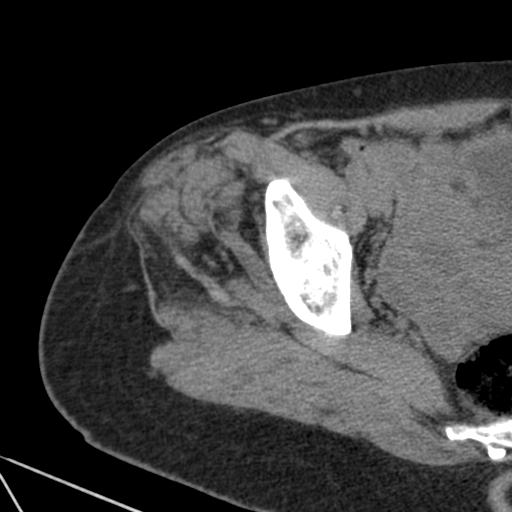
[im 49/85  soft-tissue]
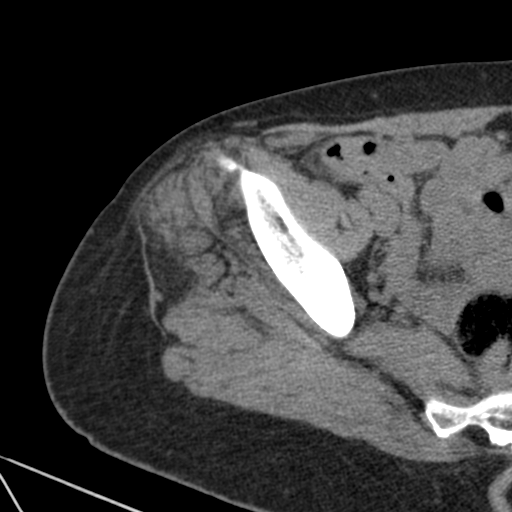
[im 55/85  soft-tissue]
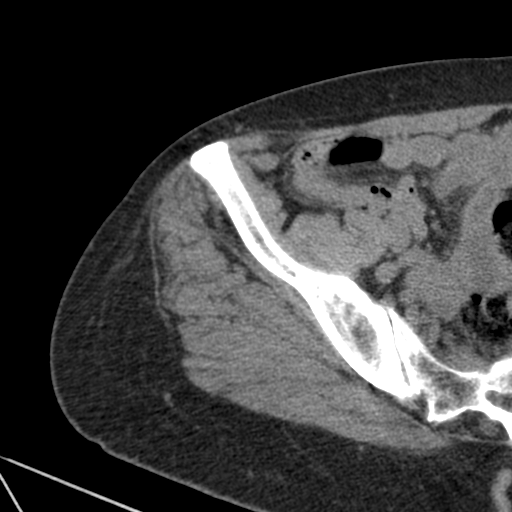
[im 55/85  bone]
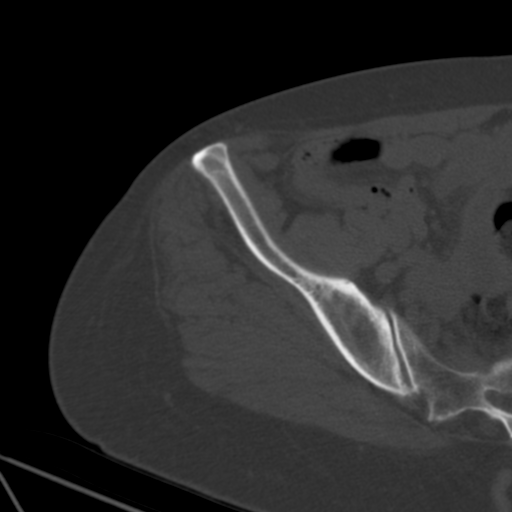
[im 60/85  soft-tissue]
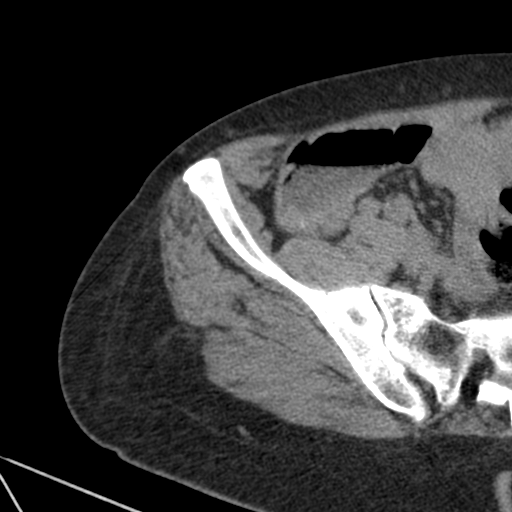
[im 68/85  soft-tissue]
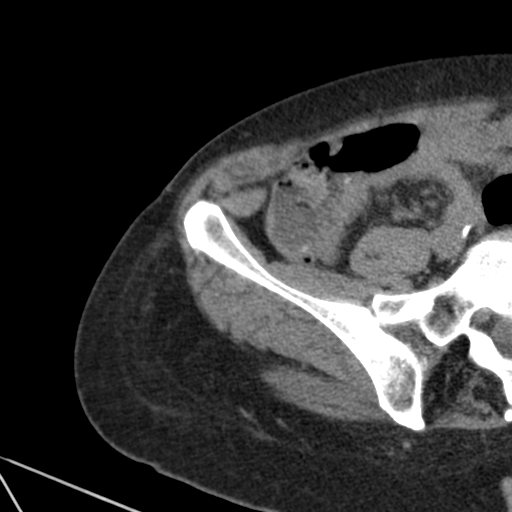
[im 74/85  soft-tissue]
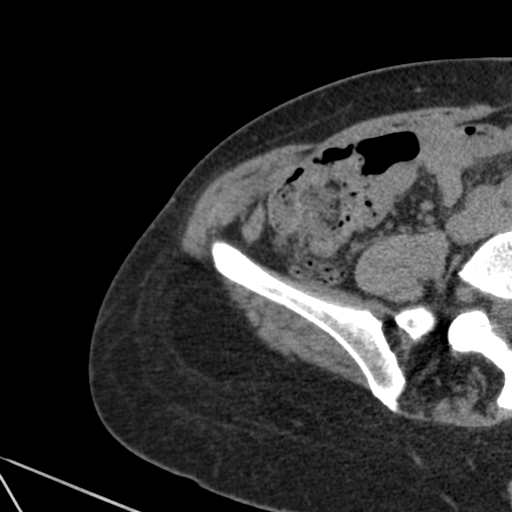
[im 74/85  lung]
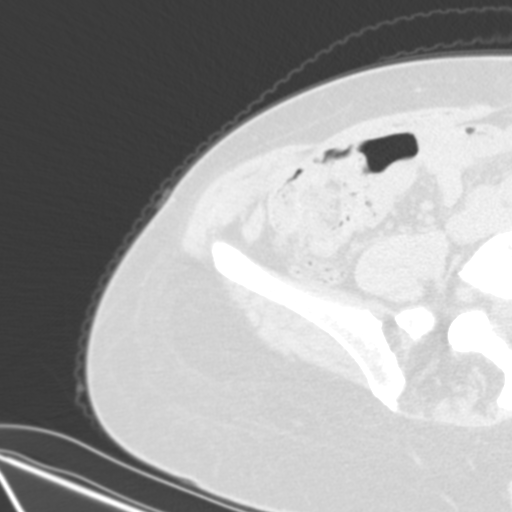
[im 76/85  lung]
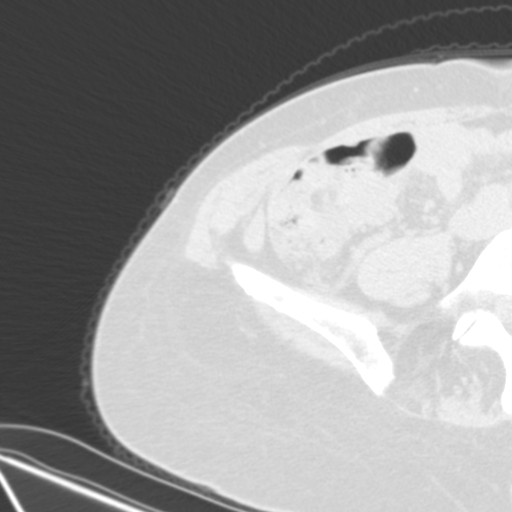
[im 79/85  soft-tissue]
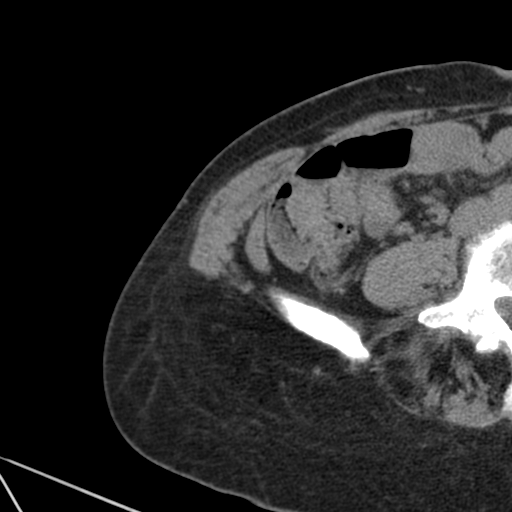
[im 79/85  lung]
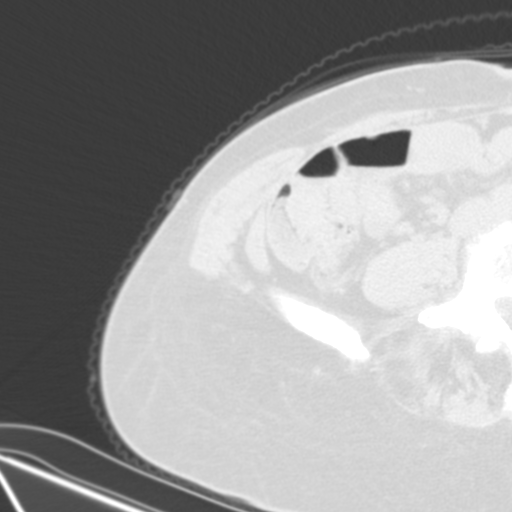
[im 82/85  lung]
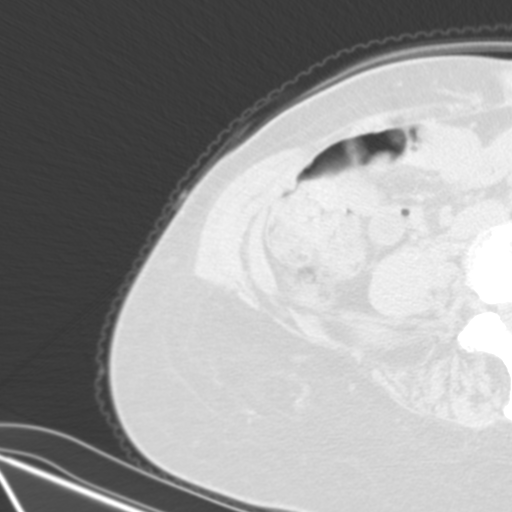

[15 of 32 positions shown; findings below may reference images not displayed]

FINDINGS: Bones/Joint/Cartilage

No acute bony or joint abnormality is identified. A 1.3 cm in
diameter densely sclerotic lesion in the right sacrum is consistent
with a bone island and unchanged since 7660. No lytic lesion. There
is some spurring about the greater trochanter. Osteophytosis and
sclerosis are seen at the symphysis pubis. Minimal osteophytosis at
the right SI joint is noted. 2-3 tiny subchondral cysts are seen in
the right acetabulum.

Ligaments

Suboptimally assessed by CT.

Muscles and Tendons

There is moderate fatty atrophy of both the right gluteus medius and
minimus. Calcifications are seen in the distal tendons both of which
appear partially torn.

Soft tissues

Markers are placed over the region of concern anterior and lateral
to the right hip. No underlying fluid collection or mass is
identified. Underlying musculature appears normal.
IMPRESSION: Negative for fluid collection or mass. No finding to explain a
palpable abnormality.

Chronic tendinosis and partial tearing of the right gluteus medius
and minimus muscles with associated moderate fatty atrophy both
muscle bellies.

Mild osteoarthritis right hip and right SI joint. Moderate
degenerative change at the symphysis pubis also noted.

## 2022-08-17 DIAGNOSIS — H5213 Myopia, bilateral: Secondary | ICD-10-CM | POA: Diagnosis not present

## 2022-08-17 DIAGNOSIS — H524 Presbyopia: Secondary | ICD-10-CM | POA: Diagnosis not present

## 2022-08-17 DIAGNOSIS — H52203 Unspecified astigmatism, bilateral: Secondary | ICD-10-CM | POA: Diagnosis not present

## 2022-09-14 DIAGNOSIS — M5416 Radiculopathy, lumbar region: Secondary | ICD-10-CM | POA: Diagnosis not present

## 2022-09-14 DIAGNOSIS — M542 Cervicalgia: Secondary | ICD-10-CM | POA: Diagnosis not present

## 2022-09-19 DIAGNOSIS — M542 Cervicalgia: Secondary | ICD-10-CM | POA: Diagnosis not present

## 2022-09-19 DIAGNOSIS — M5451 Vertebrogenic low back pain: Secondary | ICD-10-CM | POA: Diagnosis not present

## 2022-09-26 DIAGNOSIS — M542 Cervicalgia: Secondary | ICD-10-CM | POA: Diagnosis not present

## 2022-09-26 DIAGNOSIS — M5451 Vertebrogenic low back pain: Secondary | ICD-10-CM | POA: Diagnosis not present

## 2022-09-28 DIAGNOSIS — M5451 Vertebrogenic low back pain: Secondary | ICD-10-CM | POA: Diagnosis not present

## 2022-09-28 DIAGNOSIS — M542 Cervicalgia: Secondary | ICD-10-CM | POA: Diagnosis not present

## 2022-10-03 DIAGNOSIS — M542 Cervicalgia: Secondary | ICD-10-CM | POA: Diagnosis not present

## 2022-10-03 DIAGNOSIS — M5451 Vertebrogenic low back pain: Secondary | ICD-10-CM | POA: Diagnosis not present

## 2022-10-05 DIAGNOSIS — M5451 Vertebrogenic low back pain: Secondary | ICD-10-CM | POA: Diagnosis not present

## 2022-10-05 DIAGNOSIS — M542 Cervicalgia: Secondary | ICD-10-CM | POA: Diagnosis not present

## 2022-10-17 DIAGNOSIS — M5451 Vertebrogenic low back pain: Secondary | ICD-10-CM | POA: Diagnosis not present

## 2022-10-17 DIAGNOSIS — M542 Cervicalgia: Secondary | ICD-10-CM | POA: Diagnosis not present

## 2022-10-19 DIAGNOSIS — M542 Cervicalgia: Secondary | ICD-10-CM | POA: Diagnosis not present

## 2022-10-19 DIAGNOSIS — M5451 Vertebrogenic low back pain: Secondary | ICD-10-CM | POA: Diagnosis not present

## 2022-10-23 DIAGNOSIS — R110 Nausea: Secondary | ICD-10-CM | POA: Diagnosis not present

## 2022-10-23 DIAGNOSIS — U071 COVID-19: Secondary | ICD-10-CM | POA: Diagnosis not present

## 2022-10-23 DIAGNOSIS — R52 Pain, unspecified: Secondary | ICD-10-CM | POA: Diagnosis not present

## 2022-10-26 DIAGNOSIS — M542 Cervicalgia: Secondary | ICD-10-CM | POA: Diagnosis not present

## 2022-10-26 DIAGNOSIS — M5416 Radiculopathy, lumbar region: Secondary | ICD-10-CM | POA: Diagnosis not present

## 2022-11-13 ENCOUNTER — Other Ambulatory Visit (HOSPITAL_COMMUNITY): Payer: Self-pay | Admitting: Neurological Surgery

## 2022-11-13 DIAGNOSIS — M542 Cervicalgia: Secondary | ICD-10-CM

## 2022-11-13 DIAGNOSIS — Z008 Encounter for other general examination: Secondary | ICD-10-CM | POA: Diagnosis not present

## 2022-11-13 DIAGNOSIS — M5416 Radiculopathy, lumbar region: Secondary | ICD-10-CM

## 2022-11-28 ENCOUNTER — Other Ambulatory Visit: Payer: Self-pay | Admitting: Neurological Surgery

## 2022-11-28 DIAGNOSIS — M542 Cervicalgia: Secondary | ICD-10-CM

## 2022-12-12 DIAGNOSIS — Z23 Encounter for immunization: Secondary | ICD-10-CM | POA: Diagnosis not present

## 2022-12-20 DIAGNOSIS — E039 Hypothyroidism, unspecified: Secondary | ICD-10-CM | POA: Diagnosis not present

## 2022-12-20 DIAGNOSIS — E7800 Pure hypercholesterolemia, unspecified: Secondary | ICD-10-CM | POA: Diagnosis not present

## 2022-12-24 ENCOUNTER — Ambulatory Visit
Admission: RE | Admit: 2022-12-24 | Discharge: 2022-12-24 | Disposition: A | Discharge status: Home / Self Care | Payer: Medicare HMO | Source: Ambulatory Visit | Attending: Neurological Surgery | Admitting: Neurological Surgery

## 2022-12-24 DIAGNOSIS — M542 Cervicalgia: Secondary | ICD-10-CM

## 2022-12-26 ENCOUNTER — Other Ambulatory Visit (HOSPITAL_BASED_OUTPATIENT_CLINIC_OR_DEPARTMENT_OTHER): Payer: Self-pay

## 2022-12-26 MED ORDER — CLOBETASOL PROPIONATE 0.05 % EX OINT: 1.0000 | TOPICAL_OINTMENT | Freq: Two times a day (BID) | CUTANEOUS | 0 refills | Status: AC

## 2023-01-17 DIAGNOSIS — R197 Diarrhea, unspecified: Secondary | ICD-10-CM | POA: Diagnosis not present

## 2023-01-17 DIAGNOSIS — R634 Abnormal weight loss: Secondary | ICD-10-CM | POA: Diagnosis not present

## 2023-01-17 DIAGNOSIS — R140 Abdominal distension (gaseous): Secondary | ICD-10-CM | POA: Diagnosis not present

## 2023-01-18 DIAGNOSIS — M542 Cervicalgia: Secondary | ICD-10-CM | POA: Diagnosis not present

## 2023-01-30 DIAGNOSIS — Q438 Other specified congenital malformations of intestine: Secondary | ICD-10-CM | POA: Diagnosis not present

## 2023-01-30 DIAGNOSIS — R197 Diarrhea, unspecified: Secondary | ICD-10-CM | POA: Diagnosis not present

## 2023-01-30 DIAGNOSIS — Z800 Family history of malignant neoplasm of digestive organs: Secondary | ICD-10-CM | POA: Diagnosis not present

## 2023-01-30 DIAGNOSIS — K52832 Lymphocytic colitis: Secondary | ICD-10-CM | POA: Diagnosis not present

## 2023-01-30 DIAGNOSIS — K3189 Other diseases of stomach and duodenum: Secondary | ICD-10-CM | POA: Diagnosis not present

## 2023-01-30 DIAGNOSIS — K529 Noninfective gastroenteritis and colitis, unspecified: Secondary | ICD-10-CM | POA: Diagnosis not present

## 2023-01-30 DIAGNOSIS — R634 Abnormal weight loss: Secondary | ICD-10-CM | POA: Diagnosis not present

## 2023-01-30 DIAGNOSIS — K2980 Duodenitis without bleeding: Secondary | ICD-10-CM | POA: Diagnosis not present

## 2023-01-30 DIAGNOSIS — K319 Disease of stomach and duodenum, unspecified: Secondary | ICD-10-CM | POA: Diagnosis not present

## 2023-01-30 DIAGNOSIS — K2950 Unspecified chronic gastritis without bleeding: Secondary | ICD-10-CM | POA: Diagnosis not present

## 2023-02-08 ENCOUNTER — Telehealth: Payer: Self-pay | Admitting: Gastroenterology

## 2023-02-08 NOTE — Telephone Encounter (Signed)
Good morning Dr. Myrtie Neither,   We received a referral for this patient to be evaluated for lymphocytic colitis. Patient has been previously followed by Dr. Elnoria Howard and Dr. Loreta Ave from [facility]. Patient had a colonoscopy on 01/30/2023. She states she is unhappy with the care she received and would like to establish GI care with you. Records were obtained and scanned into Media for you to review. Would you please advise on scheduling?  Thank you.

## 2023-02-08 NOTE — Telephone Encounter (Signed)
Request received to transfer GI care from outside practice to [facility].  We appreciate the interest in our practice, however due to high demand from patients without established GI providers, I cannot accommodate this transfer.   - H. Myrtie Neither, MD

## 2023-02-19 ENCOUNTER — Encounter (HOSPITAL_BASED_OUTPATIENT_CLINIC_OR_DEPARTMENT_OTHER): Payer: Self-pay | Admitting: Obstetrics & Gynecology

## 2023-02-19 ENCOUNTER — Ambulatory Visit (INDEPENDENT_AMBULATORY_CARE_PROVIDER_SITE_OTHER): Payer: Medicare Other | Admitting: Obstetrics & Gynecology

## 2023-02-19 VITALS — BP 122/90 | HR 75 | Ht 63.25 in | Wt 126.4 lb

## 2023-02-19 DIAGNOSIS — Z9189 Other specified personal risk factors, not elsewhere classified: Secondary | ICD-10-CM | POA: Diagnosis not present

## 2023-02-19 DIAGNOSIS — A630 Anogenital (venereal) warts: Secondary | ICD-10-CM

## 2023-02-19 DIAGNOSIS — L900 Lichen sclerosus et atrophicus: Secondary | ICD-10-CM | POA: Diagnosis not present

## 2023-02-19 DIAGNOSIS — Z780 Asymptomatic menopausal state: Secondary | ICD-10-CM

## 2023-02-19 DIAGNOSIS — Z659 Problem related to unspecified psychosocial circumstances: Secondary | ICD-10-CM | POA: Diagnosis not present

## 2023-02-19 NOTE — Progress Notes (Unsigned)
[id number] y.o. G51P0 Married White or Caucasian female here for breast and pelvic exam.  I am also following her for h/o lichen sclerosus.  Not needing steroid use much.  Does not need RF right now.    PMP.  Denies vaginal bleeding.  Has been having issues with diarrhea this fall.  Has repeat colonoscopy 01/30/2023 with Dr. Elnoria Howard.  Lots of stressors with her mother who is currently in the hospital for UTI related sepsis.  Pt is on effexor 75mg .  Not on extended release and discussed switching.  She is going to go home and double check about her medications for me to be sure she is on the immediate release.  If so, can change RX for her.  No LMP recorded. Patient is postmenopausal.          Sexually active: No.  H/O STD:  h/o condyloma acuminatum  Health Maintenance: PCP:  Dr. Jacqulyn Bath.  Last wellness appt was in the fall, 2024.  Did blood work at that appt:  yes Vaccines are up to date:  yes Colonoscopy:  2018, Dr. Loreta Ave.  01/2022.  Had MMG:  pt reports this was done.  MRI last done in 2023.  Will call [facility] and get copies of this BMD:  08/24/2020 Last pap smear:  2017.   H/o abnormal pap smear:      reports that she has never smoked. She has never used smokeless tobacco. She reports current alcohol use. She reports that she does not use drugs.  Past Medical History:  Diagnosis Date   Allergy    Anxiety    Arthritis    Asthma    Blood transfusion    Depression    genital wart    GERD (gastroesophageal reflux disease)    RESOLVED   Heart murmur    Mental disorder    depression   Thyroid disease    hypothyroidism... previously been both    Past Surgical History:  Procedure Laterality Date   BREAST REDUCTION SURGERY  1991   CATARACT EXTRACTION, BILATERAL     DILATION AND CURETTAGE OF UTERUS     explant of spine stimulator  2012   HERNIA REPAIR     RIH- 2005, Femoral hernia- 2007, [facility]- 2008   LAMINECTOMY  2005   L4 and L5   LUMBAR SPINAL CORD SIMULATOR LEAD REMOVAL      MENISCUS REPAIR Right    POLYPECTOMY  03/2007   ENDOMETRIAL   RADIAL OPTIC NEUROTOMY     ROTATOR CUFF REPAIR  2004   right   SEPTOPLASTY     SPINE SURGERY  2007   spinal cord implant stimulator   TONSILLECTOMY     as a child    Current Outpatient Medications  Medication Sig Dispense Refill   Ascorbic Acid (VITAMIN C) 100 MG tablet Take 100 mg by mouth daily.     budesonide (ENTOCORT EC) 3 MG 24 hr capsule Take 3 capsules by mouth daily.     calcium carbonate 200 MG capsule Take 250 mg by mouth 2 (two) times daily with a meal.     Cholecalciferol (VITAMIN D3 PO) Take by mouth daily.     clobetasol ointment (TEMOVATE) 0.05 % Apply 1 Application topically 2 (two) times daily. Apply as directed twice daily 60 g 0   clonazePAM (KLONOPIN) 1 MG tablet Ad lib.     docusate sodium (COLACE) 100 MG capsule Take 100 mg by mouth 2 (two) times daily.     hydrocortisone cream  1 % Apply 1 application topically 2 (two) times a week.     levothyroxine (SYNTHROID) 112 MCG tablet Take 112 mcg by mouth daily before breakfast.     magnesium 30 MG tablet Take 30 mg by mouth 2 (two) times daily.     rosuvastatin (CRESTOR) 5 MG tablet Take 5 mg by mouth daily.     tiZANidine (ZANAFLEX) 4 MG capsule Take 4 mg by mouth as needed for muscle spasms.     venlafaxine (EFFEXOR) 75 MG tablet Take 75 mg by mouth 2 (two) times daily.     vitamin E 100 UNIT capsule Take 100 Units by mouth daily.     COVID-19 mRNA Vac-TriS, Pfizer, (PFIZER-BIONT COVID-19 VAC-TRIS) SUSP injection Inject into the muscle. 0.3 mL 0   COVID-19 mRNA vaccine 2023-2024 (COMIRNATY) syringe Inject into the muscle. 0.3 mL 0   diclofenac sodium (VOLTAREN) 1 % GEL Apply topically 4 (four) times daily. (Patient not taking: Reported on 02/19/2023)     RSV vaccine recomb adjuvanted (AREXVY) 120 MCG/0.5ML injection Inject into the muscle. 0.5 mL 0   No current facility-administered medications for this visit.    Family History  Problem Relation Age  of Onset   Dementia Mother    Breast cancer Mother    Cancer Father        colon   Cancer Paternal Aunt        BREAST   Stroke Maternal Grandmother     Review of Systems  Constitutional: Negative.   Genitourinary: Negative.     Exam:   BP (!) 141/85 (BP Location: Right Arm, Patient Position: Sitting, Cuff Size: Normal)   Pulse 75   Ht 5' 3.25" (1.607 m)   Wt 126 lb 6.4 oz (57.3 kg)   BMI 22.21 kg/m   Height: 5' 3.25" (160.7 cm)  General appearance: alert, cooperative and appears stated age Breasts: normal appearance, no masses or tenderness Abdomen: soft, non-tender; bowel sounds normal; no masses,  no organomegaly Lymph nodes: Cervical, supraclavicular, and axillary nodes normal.  No abnormal inguinal nodes palpated Neurologic: Grossly normal  Pelvic: External genitalia:  no lesions              Urethra:  normal appearing urethra with no masses, tenderness or lesions              Bartholins and Skenes: normal                 Vagina: normal appearing vagina with atrophic changes and no discharge, no lesions              Cervix: no lesions              Pap taken: No. Bimanual Exam:  Uterus:  normal size, contour, position, consistency, mobility, non-tender              Adnexa: no mass, fullness, tenderness               Rectovaginal: Confirms               Anus:  normal sphincter tone, no lesions  Chaperone, Ina Homes, CMA, was present for exam.  Assessment/Plan: 1. GYN exam for high-risk Medicare patient (Primary) - Pap smear 2017.  Pt was comfortable stopping screening pap smears at age 48 and met criteria for this as well. - Mammogram was done this year.  Pt absolutely sure of it.  Will reach out to [facility] for release of records - Colonoscopy just done  in early December by Dr. Elnoria Howard - Bone mineral density updated this year as well.  Will try to get copy of results - lab work done with PCP, Dr. Jacqulyn Bath - vaccines reviewed/updated  2. Lichen sclerosus et  atrophicus - skin looks good today.  No ulcerations, skin thickening noted.  3. Venereal wart - remote hx  4. Other social stressor - she will communicate with me via text or mychart and let me know if on immediate release dosing.  If so, will switch to 75mg  ER/XL dosing.    5. Postmenopausal - not on HRT.  No vaginal bleeding.

## 2023-02-20 MED ORDER — VENLAFAXINE HCL ER 75 MG PO CP24: 75.0000 mg | ORAL_CAPSULE | Freq: Every day | ORAL | 1 refills | Status: AC

## 2023-02-23 NOTE — Telephone Encounter (Signed)
Left detailed messaged advising patient of previous recommendations.

## 2023-02-26 ENCOUNTER — Other Ambulatory Visit (HOSPITAL_BASED_OUTPATIENT_CLINIC_OR_DEPARTMENT_OTHER): Payer: Self-pay

## 2023-02-26 MED ORDER — COMIRNATY 30 MCG/0.3ML IM SUSY
PREFILLED_SYRINGE | INTRAMUSCULAR | 0 refills | Status: DC
  Filled 2023-02-26: qty 0.3, 1d supply, fill #0

## 2023-03-05 ENCOUNTER — Encounter (HOSPITAL_BASED_OUTPATIENT_CLINIC_OR_DEPARTMENT_OTHER): Payer: Self-pay | Admitting: *Deleted

## 2023-03-19 ENCOUNTER — Encounter (HOSPITAL_BASED_OUTPATIENT_CLINIC_OR_DEPARTMENT_OTHER): Payer: Self-pay | Admitting: Obstetrics & Gynecology

## 2023-03-21 ENCOUNTER — Encounter: Payer: Self-pay | Admitting: Podiatry

## 2023-03-21 ENCOUNTER — Ambulatory Visit (INDEPENDENT_AMBULATORY_CARE_PROVIDER_SITE_OTHER): Payer: Medicare Other

## 2023-03-21 ENCOUNTER — Ambulatory Visit: Payer: Medicare Other | Admitting: Podiatry

## 2023-03-21 DIAGNOSIS — M21612 Bunion of left foot: Secondary | ICD-10-CM | POA: Diagnosis not present

## 2023-03-21 DIAGNOSIS — M7752 Other enthesopathy of left foot: Secondary | ICD-10-CM | POA: Diagnosis not present

## 2023-03-21 DIAGNOSIS — M79672 Pain in left foot: Secondary | ICD-10-CM

## 2023-03-21 DIAGNOSIS — M79671 Pain in right foot: Secondary | ICD-10-CM

## 2023-03-21 DIAGNOSIS — M21611 Bunion of right foot: Secondary | ICD-10-CM

## 2023-03-21 MED ORDER — DOXYCYCLINE HYCLATE 100 MG PO TABS: 100.0000 mg | ORAL_TABLET | Freq: Two times a day (BID) | ORAL | 0 refills | Status: AC

## 2023-03-21 MED ORDER — TRIAMCINOLONE ACETONIDE 10 MG/ML IJ SUSP: 10.0000 mg | Freq: Once | INTRAMUSCULAR | Status: AC

## 2023-03-22 NOTE — Progress Notes (Signed)
Subjective:   Patient ID: Tracey [name], female   DOB: 73 y.o.   MRN: [id number]   HPI Patient presents stating that she has structural deformity of both feet and is developing a lot of pain between the big toe second toe left over right foot which has been going on around a year.  Patient has tried padding cushioning without relief of symptoms.  Patient does not smoke likes to be active   Review of Systems  All other systems reviewed and are negative.       Objective:  Physical Exam Vitals and nursing note reviewed.  Constitutional:      Appearance: She is well-developed.  Pulmonary:     Effort: Pulmonary effort is normal.  Musculoskeletal:        General: Normal range of motion.  Skin:    General: Skin is warm.  Neurological:     Mental Status: She is alert.     Neurovascular status intact muscle strength was found to be adequate range of motion adequate subtalar midtarsal joint.  Significant structural malalignment first metatarsal with large prominence pressure between the big toe second toe with inflammation of the inner phalangeal joint.  Good digital perfusion well oriented x 3 with keratotic tissue present     Assessment:  HAV deformity bilateral with abnormal positioning hallux against the second toe with inflammation fluid around the inner phalangeal joint digit 2 left over right with pain     Plan:  H&P reviewed conditions and x-rays and went ahead today for the left did sterile prep injected the inner phalangeal joint #2 toe 2 mg dexamethasone Kenalog 5 mg Xylocaine debrided lesions courtesy on each second toe applied cushioning to the big toes also discussed some redness around the big toe left that she is concerned about as precautionary measure placed on doxycycline.  Reappoint to recheck as symptoms indicate may require more aggressive treatments depending on response  X-rays indicate that there is significant structural bunion with elevation of the  intermetatarsal angle and pressure between the big toe second toe

## 2023-03-30 DIAGNOSIS — M79672 Pain in left foot: Secondary | ICD-10-CM | POA: Diagnosis not present

## 2023-04-05 ENCOUNTER — Ambulatory Visit (HOSPITAL_COMMUNITY): Admit: 2023-04-05 | Payer: Medicare HMO

## 2023-04-05 ENCOUNTER — Ambulatory Visit (HOSPITAL_COMMUNITY): Payer: Medicare HMO

## 2023-04-05 ENCOUNTER — Other Ambulatory Visit (HOSPITAL_COMMUNITY): Payer: Medicare HMO

## 2023-04-05 SURGERY — MRI WITH ANESTHESIA
Anesthesia: General

## 2023-04-06 DIAGNOSIS — M4802 Spinal stenosis, cervical region: Secondary | ICD-10-CM | POA: Diagnosis not present

## 2023-04-06 DIAGNOSIS — M47812 Spondylosis without myelopathy or radiculopathy, cervical region: Secondary | ICD-10-CM | POA: Diagnosis not present

## 2023-04-06 DIAGNOSIS — M4312 Spondylolisthesis, cervical region: Secondary | ICD-10-CM | POA: Diagnosis not present

## 2023-05-01 DIAGNOSIS — M2578 Osteophyte, vertebrae: Secondary | ICD-10-CM | POA: Diagnosis not present

## 2023-05-01 DIAGNOSIS — M50321 Other cervical disc degeneration at C4-C5 level: Secondary | ICD-10-CM | POA: Diagnosis not present

## 2023-05-01 DIAGNOSIS — Z981 Arthrodesis status: Secondary | ICD-10-CM | POA: Diagnosis not present

## 2023-05-01 DIAGNOSIS — M50221 Other cervical disc displacement at C4-C5 level: Secondary | ICD-10-CM | POA: Diagnosis not present

## 2023-05-01 DIAGNOSIS — M4312 Spondylolisthesis, cervical region: Secondary | ICD-10-CM | POA: Diagnosis not present

## 2023-05-01 DIAGNOSIS — Z79899 Other long term (current) drug therapy: Secondary | ICD-10-CM | POA: Diagnosis not present

## 2023-05-01 DIAGNOSIS — M50322 Other cervical disc degeneration at C5-C6 level: Secondary | ICD-10-CM | POA: Diagnosis not present

## 2023-05-01 DIAGNOSIS — M47812 Spondylosis without myelopathy or radiculopathy, cervical region: Secondary | ICD-10-CM | POA: Diagnosis not present

## 2023-05-01 DIAGNOSIS — E039 Hypothyroidism, unspecified: Secondary | ICD-10-CM | POA: Diagnosis not present

## 2023-05-01 DIAGNOSIS — M4802 Spinal stenosis, cervical region: Secondary | ICD-10-CM | POA: Diagnosis not present

## 2023-05-01 DIAGNOSIS — M50323 Other cervical disc degeneration at C6-C7 level: Secondary | ICD-10-CM | POA: Diagnosis not present

## 2023-05-01 DIAGNOSIS — M50222 Other cervical disc displacement at C5-C6 level: Secondary | ICD-10-CM | POA: Diagnosis not present

## 2023-05-02 DIAGNOSIS — M47812 Spondylosis without myelopathy or radiculopathy, cervical region: Secondary | ICD-10-CM | POA: Diagnosis not present

## 2023-05-02 DIAGNOSIS — Z79899 Other long term (current) drug therapy: Secondary | ICD-10-CM | POA: Diagnosis not present

## 2023-05-02 DIAGNOSIS — E039 Hypothyroidism, unspecified: Secondary | ICD-10-CM | POA: Diagnosis not present

## 2023-05-02 DIAGNOSIS — M50222 Other cervical disc displacement at C5-C6 level: Secondary | ICD-10-CM | POA: Diagnosis not present

## 2023-05-02 DIAGNOSIS — M4312 Spondylolisthesis, cervical region: Secondary | ICD-10-CM | POA: Diagnosis not present

## 2023-05-07 ENCOUNTER — Other Ambulatory Visit (HOSPITAL_BASED_OUTPATIENT_CLINIC_OR_DEPARTMENT_OTHER): Payer: Self-pay | Admitting: Obstetrics & Gynecology

## 2023-05-08 NOTE — Telephone Encounter (Signed)
 Called and spoke with patient. Patient states she does not need this medication and to please cancel the request. tbw

## 2023-06-07 DIAGNOSIS — K52838 Other microscopic colitis: Secondary | ICD-10-CM | POA: Diagnosis not present

## 2023-06-07 DIAGNOSIS — E042 Nontoxic multinodular goiter: Secondary | ICD-10-CM | POA: Diagnosis not present

## 2023-06-07 DIAGNOSIS — Z91030 Bee allergy status: Secondary | ICD-10-CM | POA: Diagnosis not present

## 2023-06-07 DIAGNOSIS — E7800 Pure hypercholesterolemia, unspecified: Secondary | ICD-10-CM | POA: Diagnosis not present

## 2023-06-07 DIAGNOSIS — M47812 Spondylosis without myelopathy or radiculopathy, cervical region: Secondary | ICD-10-CM | POA: Diagnosis not present

## 2023-06-07 DIAGNOSIS — G4700 Insomnia, unspecified: Secondary | ICD-10-CM | POA: Diagnosis not present

## 2023-06-07 DIAGNOSIS — M8580 Other specified disorders of bone density and structure, unspecified site: Secondary | ICD-10-CM | POA: Diagnosis not present

## 2023-06-07 DIAGNOSIS — E039 Hypothyroidism, unspecified: Secondary | ICD-10-CM | POA: Diagnosis not present

## 2023-06-07 DIAGNOSIS — M797 Fibromyalgia: Secondary | ICD-10-CM | POA: Diagnosis not present

## 2023-06-08 DIAGNOSIS — M47812 Spondylosis without myelopathy or radiculopathy, cervical region: Secondary | ICD-10-CM | POA: Diagnosis not present

## 2023-06-08 DIAGNOSIS — Z9889 Other specified postprocedural states: Secondary | ICD-10-CM | POA: Diagnosis not present

## 2023-06-08 DIAGNOSIS — M4802 Spinal stenosis, cervical region: Secondary | ICD-10-CM | POA: Diagnosis not present

## 2023-06-25 ENCOUNTER — Other Ambulatory Visit: Payer: Self-pay | Admitting: General Surgery

## 2023-06-25 DIAGNOSIS — R92343 Mammographic extreme density, bilateral breasts: Secondary | ICD-10-CM

## 2023-06-25 DIAGNOSIS — Z803 Family history of malignant neoplasm of breast: Secondary | ICD-10-CM

## 2023-07-13 DIAGNOSIS — R92333 Mammographic heterogeneous density, bilateral breasts: Secondary | ICD-10-CM | POA: Diagnosis not present

## 2023-07-13 DIAGNOSIS — Z1231 Encounter for screening mammogram for malignant neoplasm of breast: Secondary | ICD-10-CM | POA: Diagnosis not present

## 2023-07-27 DIAGNOSIS — L603 Nail dystrophy: Secondary | ICD-10-CM | POA: Diagnosis not present

## 2023-07-27 DIAGNOSIS — M79672 Pain in left foot: Secondary | ICD-10-CM | POA: Diagnosis not present

## 2023-07-27 DIAGNOSIS — L02612 Cutaneous abscess of left foot: Secondary | ICD-10-CM | POA: Diagnosis not present

## 2023-08-01 DIAGNOSIS — L02612 Cutaneous abscess of left foot: Secondary | ICD-10-CM | POA: Diagnosis not present

## 2023-08-06 ENCOUNTER — Ambulatory Visit: Admitting: Podiatry

## 2023-08-25 ENCOUNTER — Ambulatory Visit
Admission: RE | Admit: 2023-08-25 | Discharge: 2023-08-25 | Disposition: A | Discharge status: Home / Self Care | Payer: Self-pay | Source: Ambulatory Visit | Attending: General Surgery | Admitting: General Surgery

## 2023-08-25 DIAGNOSIS — Z1239 Encounter for other screening for malignant neoplasm of breast: Secondary | ICD-10-CM | POA: Diagnosis not present

## 2023-08-25 DIAGNOSIS — Z803 Family history of malignant neoplasm of breast: Secondary | ICD-10-CM | POA: Diagnosis not present

## 2023-08-25 DIAGNOSIS — R92343 Mammographic extreme density, bilateral breasts: Secondary | ICD-10-CM

## 2023-08-25 MED ORDER — GADOPICLENOL 0.5 MMOL/ML IV SOLN
6.0000 mL | Freq: Once | INTRAVENOUS | Status: AC | PRN
  Administered 2023-08-25: 6 mL via INTRAVENOUS

## 2023-08-27 ENCOUNTER — Ambulatory Visit: Payer: Self-pay | Admitting: General Surgery

## 2023-09-04 DIAGNOSIS — R1310 Dysphagia, unspecified: Secondary | ICD-10-CM | POA: Diagnosis not present

## 2023-09-04 DIAGNOSIS — K52832 Lymphocytic colitis: Secondary | ICD-10-CM | POA: Diagnosis not present

## 2023-09-20 DIAGNOSIS — M25562 Pain in left knee: Secondary | ICD-10-CM | POA: Diagnosis not present

## 2023-10-11 DIAGNOSIS — D1801 Hemangioma of skin and subcutaneous tissue: Secondary | ICD-10-CM | POA: Diagnosis not present

## 2023-10-11 DIAGNOSIS — D225 Melanocytic nevi of trunk: Secondary | ICD-10-CM | POA: Diagnosis not present

## 2023-10-11 DIAGNOSIS — L218 Other seborrheic dermatitis: Secondary | ICD-10-CM | POA: Diagnosis not present

## 2023-10-11 DIAGNOSIS — L812 Freckles: Secondary | ICD-10-CM | POA: Diagnosis not present

## 2023-10-11 DIAGNOSIS — L821 Other seborrheic keratosis: Secondary | ICD-10-CM | POA: Diagnosis not present

## 2023-11-26 DIAGNOSIS — H524 Presbyopia: Secondary | ICD-10-CM | POA: Diagnosis not present

## 2023-11-26 DIAGNOSIS — H52203 Unspecified astigmatism, bilateral: Secondary | ICD-10-CM | POA: Diagnosis not present

## 2023-11-26 DIAGNOSIS — H5203 Hypermetropia, bilateral: Secondary | ICD-10-CM | POA: Diagnosis not present

## 2023-11-26 DIAGNOSIS — H43813 Vitreous degeneration, bilateral: Secondary | ICD-10-CM | POA: Diagnosis not present

## 2023-11-26 DIAGNOSIS — H04123 Dry eye syndrome of bilateral lacrimal glands: Secondary | ICD-10-CM | POA: Diagnosis not present

## 2023-12-08 DIAGNOSIS — M47896 Other spondylosis, lumbar region: Secondary | ICD-10-CM | POA: Diagnosis not present

## 2023-12-19 DIAGNOSIS — E7800 Pure hypercholesterolemia, unspecified: Secondary | ICD-10-CM | POA: Diagnosis not present

## 2023-12-19 DIAGNOSIS — M6281 Muscle weakness (generalized): Secondary | ICD-10-CM | POA: Diagnosis not present

## 2023-12-19 DIAGNOSIS — M2550 Pain in unspecified joint: Secondary | ICD-10-CM | POA: Diagnosis not present

## 2023-12-19 DIAGNOSIS — R2689 Other abnormalities of gait and mobility: Secondary | ICD-10-CM | POA: Diagnosis not present

## 2023-12-19 DIAGNOSIS — R6889 Other general symptoms and signs: Secondary | ICD-10-CM | POA: Diagnosis not present

## 2023-12-25 DIAGNOSIS — E039 Hypothyroidism, unspecified: Secondary | ICD-10-CM | POA: Diagnosis not present

## 2023-12-25 DIAGNOSIS — M8580 Other specified disorders of bone density and structure, unspecified site: Secondary | ICD-10-CM | POA: Diagnosis not present

## 2023-12-25 DIAGNOSIS — E7800 Pure hypercholesterolemia, unspecified: Secondary | ICD-10-CM | POA: Diagnosis not present

## 2023-12-28 ENCOUNTER — Other Ambulatory Visit: Payer: Self-pay | Admitting: Internal Medicine

## 2023-12-28 DIAGNOSIS — E042 Nontoxic multinodular goiter: Secondary | ICD-10-CM

## 2024-02-06 ENCOUNTER — Other Ambulatory Visit
